# Patient Record
Sex: Male | Born: 1935 | Race: White | Hispanic: No | Marital: Married | Smoking: Former smoker
Health system: Southern US, Community
[De-identification: ages and names within clinical notes are randomized; demographics above are authoritative.]

## PROBLEM LIST (undated history)

## (undated) DIAGNOSIS — Z8719 Personal history of other diseases of the digestive system: Secondary | ICD-10-CM

## (undated) DIAGNOSIS — K449 Diaphragmatic hernia without obstruction or gangrene: Secondary | ICD-10-CM

## (undated) DIAGNOSIS — K219 Gastro-esophageal reflux disease without esophagitis: Secondary | ICD-10-CM

## (undated) DIAGNOSIS — E119 Type 2 diabetes mellitus without complications: Secondary | ICD-10-CM

## (undated) DIAGNOSIS — I2510 Atherosclerotic heart disease of native coronary artery without angina pectoris: Secondary | ICD-10-CM

## (undated) DIAGNOSIS — E785 Hyperlipidemia, unspecified: Secondary | ICD-10-CM

## (undated) DIAGNOSIS — C61 Malignant neoplasm of prostate: Secondary | ICD-10-CM

## (undated) DIAGNOSIS — I10 Essential (primary) hypertension: Secondary | ICD-10-CM

## (undated) HISTORY — DX: Malignant neoplasm of prostate: C61

## (undated) HISTORY — DX: Personal history of other diseases of the digestive system: Z87.19

## (undated) HISTORY — PX: OTHER SURGICAL HISTORY: SHX169

## (undated) HISTORY — DX: Atherosclerotic heart disease of native coronary artery without angina pectoris: I25.10

## (undated) HISTORY — PX: CORONARY ARTERY BYPASS GRAFT: SHX141

## (undated) HISTORY — PX: CHOLECYSTECTOMY: SHX55

## (undated) HISTORY — DX: Diaphragmatic hernia without obstruction or gangrene: K44.9

## (undated) HISTORY — DX: Gastro-esophageal reflux disease without esophagitis: K21.9

## (undated) HISTORY — PX: STOMACH SURGERY: SHX791

## (undated) HISTORY — DX: Type 2 diabetes mellitus without complications: E11.9

## (undated) HISTORY — DX: Hyperlipidemia, unspecified: E78.5

## (undated) HISTORY — DX: Essential (primary) hypertension: I10

---

## 1998-11-04 ENCOUNTER — Ambulatory Visit (HOSPITAL_COMMUNITY): Admission: RE | Admit: 1998-11-04 | Discharge: 1998-11-04 | Payer: Self-pay | Admitting: Geriatric Medicine

## 2002-07-17 ENCOUNTER — Ambulatory Visit (HOSPITAL_COMMUNITY): Admission: RE | Admit: 2002-07-17 | Discharge: 2002-07-17 | Payer: Self-pay | Admitting: Geriatric Medicine

## 2003-09-05 ENCOUNTER — Inpatient Hospital Stay (HOSPITAL_COMMUNITY): Admission: AD | Admit: 2003-09-05 | Discharge: 2003-09-12 | Payer: Self-pay | Admitting: *Deleted

## 2004-05-05 ENCOUNTER — Ambulatory Visit (HOSPITAL_COMMUNITY): Admission: RE | Admit: 2004-05-05 | Discharge: 2004-05-05 | Payer: Self-pay | Admitting: Geriatric Medicine

## 2005-04-29 ENCOUNTER — Ambulatory Visit (HOSPITAL_COMMUNITY): Admission: RE | Admit: 2005-04-29 | Discharge: 2005-04-29 | Payer: Self-pay | Admitting: *Deleted

## 2006-07-09 ENCOUNTER — Encounter: Admission: RE | Admit: 2006-07-09 | Discharge: 2006-07-09 | Payer: Self-pay | Admitting: Geriatric Medicine

## 2009-06-11 ENCOUNTER — Encounter: Admission: RE | Admit: 2009-06-11 | Discharge: 2009-06-11 | Payer: Self-pay | Admitting: Urology

## 2009-10-03 ENCOUNTER — Inpatient Hospital Stay (HOSPITAL_COMMUNITY): Admission: EM | Admit: 2009-10-03 | Discharge: 2009-10-06 | Payer: Self-pay | Admitting: Emergency Medicine

## 2009-10-04 ENCOUNTER — Ambulatory Visit: Payer: Self-pay | Admitting: Internal Medicine

## 2009-10-04 ENCOUNTER — Encounter (INDEPENDENT_AMBULATORY_CARE_PROVIDER_SITE_OTHER): Payer: Self-pay | Admitting: Internal Medicine

## 2009-10-06 ENCOUNTER — Encounter (INDEPENDENT_AMBULATORY_CARE_PROVIDER_SITE_OTHER): Payer: Self-pay | Admitting: *Deleted

## 2009-10-07 ENCOUNTER — Telehealth: Payer: Self-pay | Admitting: Internal Medicine

## 2009-10-09 ENCOUNTER — Encounter: Payer: Self-pay | Admitting: Internal Medicine

## 2009-10-22 ENCOUNTER — Ambulatory Visit: Payer: Self-pay | Admitting: Internal Medicine

## 2009-10-22 DIAGNOSIS — D649 Anemia, unspecified: Secondary | ICD-10-CM | POA: Insufficient documentation

## 2009-10-22 DIAGNOSIS — E538 Deficiency of other specified B group vitamins: Secondary | ICD-10-CM | POA: Insufficient documentation

## 2009-10-22 DIAGNOSIS — K210 Gastro-esophageal reflux disease with esophagitis: Secondary | ICD-10-CM

## 2009-10-29 ENCOUNTER — Ambulatory Visit: Payer: Self-pay | Admitting: Internal Medicine

## 2009-11-05 ENCOUNTER — Ambulatory Visit: Payer: Self-pay | Admitting: Internal Medicine

## 2009-12-03 ENCOUNTER — Ambulatory Visit: Payer: Self-pay | Admitting: Internal Medicine

## 2010-01-06 ENCOUNTER — Ambulatory Visit: Payer: Self-pay | Admitting: Internal Medicine

## 2010-02-11 ENCOUNTER — Ambulatory Visit: Payer: Self-pay | Admitting: Internal Medicine

## 2010-02-21 ENCOUNTER — Encounter: Admission: RE | Admit: 2010-02-21 | Discharge: 2010-02-21 | Payer: Self-pay | Admitting: Geriatric Medicine

## 2010-03-11 ENCOUNTER — Ambulatory Visit: Payer: Self-pay | Admitting: Internal Medicine

## 2010-03-14 ENCOUNTER — Encounter (INDEPENDENT_AMBULATORY_CARE_PROVIDER_SITE_OTHER): Payer: Self-pay | Admitting: *Deleted

## 2010-04-11 ENCOUNTER — Ambulatory Visit: Payer: Self-pay | Admitting: Internal Medicine

## 2010-04-24 ENCOUNTER — Encounter (INDEPENDENT_AMBULATORY_CARE_PROVIDER_SITE_OTHER): Payer: Self-pay | Admitting: *Deleted

## 2010-04-24 ENCOUNTER — Ambulatory Visit: Payer: Self-pay | Admitting: Internal Medicine

## 2010-05-01 ENCOUNTER — Ambulatory Visit: Payer: Self-pay | Admitting: Internal Medicine

## 2010-05-12 ENCOUNTER — Ambulatory Visit: Payer: Self-pay | Admitting: Internal Medicine

## 2010-06-13 ENCOUNTER — Ambulatory Visit: Payer: Self-pay | Admitting: Internal Medicine

## 2010-07-14 ENCOUNTER — Ambulatory Visit: Payer: Self-pay | Admitting: Internal Medicine

## 2010-08-06 ENCOUNTER — Encounter: Payer: Self-pay | Admitting: Internal Medicine

## 2010-08-06 LAB — CONVERTED CEMR LAB: Hemoglobin: 12.7 g/dL

## 2010-08-11 ENCOUNTER — Ambulatory Visit: Payer: Self-pay | Admitting: Internal Medicine

## 2010-08-20 ENCOUNTER — Telehealth: Payer: Self-pay | Admitting: Internal Medicine

## 2010-08-20 ENCOUNTER — Encounter: Payer: Self-pay | Admitting: Internal Medicine

## 2010-08-25 ENCOUNTER — Encounter (INDEPENDENT_AMBULATORY_CARE_PROVIDER_SITE_OTHER): Payer: Self-pay | Admitting: *Deleted

## 2010-08-26 ENCOUNTER — Encounter (INDEPENDENT_AMBULATORY_CARE_PROVIDER_SITE_OTHER): Payer: Self-pay | Admitting: *Deleted

## 2010-08-26 ENCOUNTER — Ambulatory Visit: Payer: Self-pay | Admitting: Internal Medicine

## 2010-09-02 ENCOUNTER — Ambulatory Visit: Payer: Self-pay | Admitting: Internal Medicine

## 2010-09-02 HISTORY — PX: UPPER GASTROINTESTINAL ENDOSCOPY: SHX188

## 2010-09-08 ENCOUNTER — Ambulatory Visit: Payer: Self-pay | Admitting: Internal Medicine

## 2010-10-07 ENCOUNTER — Ambulatory Visit
Admission: RE | Admit: 2010-10-07 | Discharge: 2010-10-07 | Payer: Self-pay | Source: Home / Self Care | Attending: Internal Medicine | Admitting: Internal Medicine

## 2010-10-28 NOTE — Miscellaneous (Signed)
Summary: [facility] PV  Clinical Lists Changes  Observations: Added new observation of NKA: T (08/26/2010 10:33)

## 2010-10-28 NOTE — Assessment & Plan Note (Signed)
Summary: B12 # 3 OF 3, THEN MONTHLY/SP  Nurse Visit   Allergies: No Known Drug Allergies  Medication Administration  Injection # 1:    Medication: Vit B12 1000 mcg    Diagnosis: VITAMIN B12 DEFICIENCY (ICD-266.2)    Route: IM    Site: L deltoid    Exp Date: 07/2011    Lot #: [id number]    Mfr: [employer]    Patient tolerated injection without complications    Given by: Merri Ray CMA (AAMA) (November 05, 2009 8:30 AM)  Orders Added: 1)  Vit B12 1000 mcg [J3420]

## 2010-10-28 NOTE — Assessment & Plan Note (Signed)
Summary: MONTHLY B12..266.2/SP  Nurse Visit   Allergies: No Known Drug Allergies  Medication Administration  Injection # 1:    Medication: Vit B12 1000 mcg    Diagnosis: VITAMIN B12 DEFICIENCY (ICD-266.2)    Route: IM    Site: L deltoid    Exp Date: 04/2011    Lot #: [id number]    Mfr: [employer]    Comments: pt to schedule next montly b12 at front desk.    Patient tolerated injection without complications    Given by: Chales Abrahams CMA Duncan Dull) (December 03, 2009 8:20 AM)  Orders Added: 1)  Vit B12 1000 mcg [J3420]

## 2010-10-28 NOTE — Assessment & Plan Note (Signed)
Summary: MONTHLY B12 SHOT...LSW.  Nurse Visit   Medication Administration  Injection # 1:    Medication: Vit B12 1000 mcg    Diagnosis: VITAMIN B12 DEFICIENCY (ICD-266.2)    Route: IM    Site: R deltoid    Exp Date: 10/2011    Lot #: [id number]    Mfr: [employer]    Comments: pt will return on 03/11/10 for next injection.    Patient tolerated injection without complications    Given by: Francee Piccolo CMA Duncan Dull) (Feb 11, 2010 8:51 AM)  Orders Added: 1)  Vit B12 1000 mcg [J3420]

## 2010-10-28 NOTE — Assessment & Plan Note (Signed)
Summary: MONTHLY B12 SHOT  Nurse Visit   Allergies: No Known Drug Allergies  Medication Administration  Injection # 1:    Medication: Vit B12 1000 mcg    Diagnosis: VITAMIN B12 DEFICIENCY (ICD-266.2)    Route: IM    Site: R deltoid    Exp Date: 12/2011    Lot #: [id number]    Mfr: [employer]    Patient tolerated injection without complications    Given by: Milford Cage NCMA (April 11, 2010 8:44 AM)  Orders Added: 1)  Vit B12 1000 mcg [J3420]

## 2010-10-28 NOTE — Progress Notes (Signed)
Summary: triage  Phone Note Call from Patient Call back at [phone]  (Angie's home)   Caller: granddaughter, Angie Call For: Dr. Leone Payor Reason for Call: Talk to Nurse Summary of Call: pt was recently seen in hospital by Dr. Leone Payor per granddaughter and told by Dr. Leone Payor to sch a f/u appt within 10-14 days... othing available within that time frame Initial call taken by: Vallarie Mare,  October 07, 2009 11:35 AM  Follow-up for Phone Call        I have left appointment information with the patient's grandson.  10-22-09 9:30 Follow-up by: Darcey Nora RN, CGRN,  October 07, 2009 11:48 AM

## 2010-10-28 NOTE — Assessment & Plan Note (Signed)
Summary: f/u discuss endo--ch.   History of Present Illness Visit Type: Follow-up Visit Primary GI MD: Stan Head MD [facility] Primary Provider: Merlene Laughter, MD Chief Complaint: GI bleeding/discuss ENDO History of Present Illness:   1) Severe reflux esophagitsis: no heartburn, no hematemesis. Has not yet had follow-up EGD to ensure that terrible esophagitis is healed.  2) Anemia: he is following with Dr. Pete Glatter and is on ferrous sulfate and B12 (s/p BII)  3) B12 deficiency: receiving parenteral Tx   GI Review of Systems    Reports belching.      Denies abdominal pain, acid reflux, bloating, chest pain, dysphagia with liquids, dysphagia with solids, heartburn, loss of appetite, nausea, vomiting, vomiting blood, weight loss, and  weight gain.        Denies anal fissure, black tarry stools, change in bowel habit, constipation, diarrhea, diverticulosis, fecal incontinence, heme positive stool, hemorrhoids, irritable bowel syndrome, jaundice, light color stool, liver problems, rectal bleeding, and  rectal pain.    EGD  Procedure date:  10/04/2009  Findings:           1) Esophagitis in the total esophagus - biopsies taken - likely     from severe GERD     2) 1 - 2 cm hiatal hernia     3) Gastroenterostomy in the body of the stomach - normal Bilroth     II anatomy and no other abnormalities   1. ESOPHAGUS, BIOPSY:  ACUTE EROSIVE ESOPHAGITIS WITH ASSOCIATED ULCER.  NO EVIDENCE OF VIRAL CYTOPATHIC CHANGES, FUNGAL HYPHAE, INTESTINAL METAPLASIA, DYSPLASIA OR MALIGNANCY IDENTIFIED.   Current Medications (verified): 1)  Vitamin B-12 1000 Mcg Tabs (Cyanocobalamin) .... Take One By Mouth Once Daily 2)  Omeprazole 40 Mg Cpdr (Omeprazole) .... Take One By Mouth Two Times A Day 3)  Metoprolol Succinate 50 Mg Xr24h-Tab (Metoprolol Succinate) .Marland Kitchen.. 1 By Mouth Once Daily 4)  Ferrous Sulfate 325 (65 Fe) Mg Tabs (Ferrous Sulfate) .Marland Kitchen.. 1 By Mouth Once Daily 5)  Fish Oil 1000 Mg Caps (Omega-3  Fatty Acids) .... Take Two By Mouth Once Daily 6)  Flomax 0.4 Mg Caps (Tamsulosin Hcl) .Marland Kitchen.. 1 By Mouth Once Daily 7)  Gemfibrozil 600 Mg Tabs (Gemfibrozil) .Marland Kitchen.. 1 By Mouth Two Times A Day 8)  Glipizide 10 Mg Tabs (Glipizide) .Marland Kitchen.. 1 By Mouth Two Times A Day 9)  Lisinopril 20 Mg Tabs (Lisinopril) .Marland Kitchen.. 1 By Mouth Once Daily 10)  Glucophage 500 Mg Tabs (Metformin Hcl) .Marland Kitchen.. 1 By Mouth Two Times A Day 11)  Simvastatin 40 Mg Tabs (Simvastatin) .Marland Kitchen.. 1 By Mouth Once Daily 12)  Aspirin 81 Mg Tbec (Aspirin) .... Take One By Mouth Once Daily 13)  Centrum Silver  Tabs (Multiple Vitamins-Minerals) .... Take One By Mouth Once Daily 14)  Amlodipine Besylate 10 Mg Tabs (Amlodipine Besylate) .... Take 1/2 Tablet By Mouth Once Daily  Allergies (verified): No Known Drug Allergies  Past History:  Past Medical History: Reviewed history from 10/22/2009 and no changes required. Severe GERD Hiatal hernia Hypertension Diabetes dyslipidemia GI bleed hypotension Prostate Cancer  Past Surgical History: Reviewed history from 10/22/2009 and no changes required. s/p four vessel coronary artery bypass graft s/p Billroth ii Cholecystectomy stomach surgery removed 65%  Family History: Reviewed history from 10/22/2009 and no changes required. patient does not know any family history  Social History: Reviewed history from 10/22/2009 and no changes required. Occupation: retired Patient is a former smoker.  Alcohol Use - no Daily Caffeine Use coffee Illicit Drug Use - no  Review of Systems       no chest pain, dyspnea  Vital Signs:  Patient profile:   77 year old male Height:      65 inches Weight:      159.13 pounds BMI:     26.58 Pulse rate:   60 / minute Pulse rhythm:   regular BP sitting:   150 / 60  (left arm) Cuff size:   regular  Vitals Entered By: June McMurray CMA Duncan Dull) (April 24, 2010 3:45 PM)  Physical Exam  General:  elderly, NAD Lungs:  clear Heart:  s1s2 rrr 3/6 systolic  ejection  murmur   Impression & Recommendations:  Problem # 1:  REFLUX ESOPHAGITIS (ICD-530.11) Assessment Unchanged Given jow severe it was at last EGD, will re-EGD to be sure tx is working. Risks, benefits,and indications of endoscopic procedure(s) were reviewed with the patient and all questions answered.  Orders: EGD (EGD)  Problem # 2:  ANEMIA-UNSPECIFIED (ICD-285.9) Assessment: Unchanged will review recent cbc's  Problem # 3:  VITAMIN B12 DEFICIENCY (ICD-266.2) Assessment: Improved continue Tx may go to every 3 mos once labs reviewed  Patient Instructions: 1)  We will see you at your procedure on 05/01/10. 2)  [facility] Patient Information Guide given to patient.  3)  Upper Endoscopy brochure given.  4)  Copy sent to : Merlene Laughter, MD 5)  The medication list was reviewed and reconciled.  All changed / newly prescribed medications were explained.  A complete medication list was provided to the patient / caregiver.

## 2010-10-28 NOTE — Discharge Summary (Signed)
Hector Gordon, Hector Gordon                 ACCOUNT NO.:  [id number]      MEDICAL RECORD NO.:  [id number]          PATIENT TYPE:  INP      LOCATION:  5523                         FACILITY:  [facility]      PHYSICIAN:  Richarda Overlie, MD       DATE OF BIRTH:  05/19/1925      DATE OF ADMISSION:  10/03/2009   DATE OF DISCHARGE:  10/06/2009                                  DISCHARGE SUMMARY      PRIMARY CARE PHYSICIAN:  Hal T. Stoneking, M.D.      CHIEF COMPLAINT:  Vomiting blood.      DISCHARGE DIAGNOSES:   1. Esophagitis and severe gastroesophageal reflux disease.   2. A 1-2 cm hiatal hernia.   3. Status post Billroth II.   4. Hypertension.   5. Diabetes insulin dependent.   6. Dyslipidemia.   7. Status post four vessel coronary artery bypass graft.      SUBJECTIVE:  This is an 77 year old Caucasian male with history of the   above history who presents to the ER with the chief complaint of   vomiting bright red blood associated with mild dizziness.  The patient   had a couple of episodes prior to his presentation.  The patient at the   time of admission has also complained of a history of chronic black   stools for about a year.  The patient was initially found to be   hemodynamically stable.  His initial hemoglobin was 10.3.  He was   admitted for acute upper GI bleeding and had the following hospital   course.      HOSPITAL COURSE:   1. Gastrointestinal bleeding.  The patient was evaluated by [facility]       [facility], Dr. Leone Payor, and EGD was done on January 7 that showed       esophagitis throughout the length of the esophagus secondary to       gastroesophageal reflux disease.  Biopsies were done and results       were awaited.  Proton pump inhibitor b.i.d. has been recommended.       The patient also had an anemia panel and was found to have low       vitamin B12 level and has been started on vitamin B12       supplementation.   2. Hypotension.  During his hospitalization his antihypertensive         medications were held.  The patient was being restarted on half the       dose of his metoprolol, will continue his ACE inhibitor, will hold       his Norvasc.      DISPOSITION:  The patient's diet was advanced and he has tolerated   mechanical soft 1800 ADA cardiac diet which he will continue.  He is to   schedule an appointment with his PCP in 5-7 days and have a repeat CBC   done in 1 week.      DISCHARGE MEDICATIONS:   1. Cyanocobalamin 1000 mcg p.o.  daily.   2. Protonix 40 mg p.o. twice a day.   3. Metoprolol 50 mg p.o. once a day.   4. Centrum Silver 1 tablet p.o. daily.   5. Ferrous sulfate 325 p.o. daily.   6. Fish oil 1000 mg p.o. twice a day.   7. Flomax 0.4 mg.   8. Gemfibrozil 600 mg p.o. twice a day.   9. Glipizide 10 mg p.o. twice a day.   10.Lisinopril 20 mg p.o. daily.   11.Metformin 500 mg p.o. twice a day.   12.Simvastatin 40 mg p.o. daily.      ON HOLD MEDICATIONS:  Norvasc, aspirin.               Richarda Overlie, MD            NA/MEDQ  D:  10/06/2009  T:  10/06/2009  Job:  [id number]      cc:   Hal T. Stoneking, M.D.

## 2010-10-28 NOTE — Assessment & Plan Note (Signed)
Summary: MONTHLY B12 SHOT...LSW.  Nurse Visit   Allergies: No Known Drug Allergies  Medication Administration  Injection # 1:    Medication: Vit B12 1000 mcg    Diagnosis: VITAMIN B12 DEFICIENCY (ICD-266.2)    Route: IM    Site: L deltoid    Exp Date: 04/2012    Lot #: [id number]    Mfr: [employer]    Comments: Patient signed a ROI and Judeth Cornfield will fax it to Dr. Lesia Sago office to get copies of the patients 2011 labs. Once she gets the labs back she will call the patient to advise him if he needs b12 injections once a month or once every three months. I have advised pt that he does not need to make an appt for his next one we will call him.    Patient tolerated injection without complications    Given by: Harlow Mares CMA (AAMA) (August 11, 2010 9:28 AM)  Orders Added: 1)  Vit B12 1000 mcg [J3420]  Appended Document: MONTHLY B12 SHOT...LSW. for now let's do monthly B12 - until other notice  Pt notified that Dr. Leone Payor would like for him to have monthly injections.  Pt is agreeable with this plan.  Appt is scheudled for Monday 09/08/10 for next injection. Francee Piccolo CMA Duncan Dull)  September 03, 2010 12:00 PM

## 2010-10-28 NOTE — Letter (Signed)
Summary: Endoscopy- Changed to Office Visit  [facility]  [address] [place], [place] [address]   Phone: [phone]  Fax: [phone]      March 14, 2010 MRN: [id number]   [facility] [facility] [address] [place], [place]  [address]   Dear Mr. Deschler,   According to our records, it is time for you to schedule an Endoscopy. However, after reviewing your medical record, I feel that an office visit would be most appropriate to more completely evaluate you and determine your need for a repeat procedure.  Please call [phone] (option #2) at your convenience to schedule an office visit. If you have any questions, concerns, or feel that this letter is in error, we would appreciate your call.   Sincerely,     Iva Boop, M.D.  [facility]astro[facility] [phone]

## 2010-10-28 NOTE — Assessment & Plan Note (Signed)
Summary: MONTHLY B12 SHOT...LSW.  Nurse Visit   Allergies: No Known Drug Allergies  Medication Administration  Injection # 1:    Medication: Vit B12 1000 mcg    Diagnosis: VITAMIN B12 DEFICIENCY (ICD-266.2)    Route: IM    Site: L deltoid    Exp Date: 03/2012    Lot #: [id number]    Mfr: [employer]    Patient tolerated injection without complications    Given by: Merri Ray CMA (AAMA) (July 14, 2010 9:07 AM)  Orders Added: 1)  Vit B12 1000 mcg [J3420]

## 2010-10-28 NOTE — Letter (Signed)
Summary: EGD Instructions  [facility]  [address] [place], [place] [address]   Phone: [phone]  Fax: [phone]       Hector Gordon    Feb 25, 1925    MRN: [id number]       Procedure Day /Date: Tuesday 09-02-10     Arrival Time:  10:00 a.m.     Procedure Time: 11:00 a.m.     Location of Procedure:                    _ x _ [facility] (4th Floor)   PREPARATION FOR ENDOSCOPY   On Tuesday 09-02-10,  THE DAY OF THE PROCEDURE:  1.   No solid foods, milk or milk products are allowed after midnight the night before your procedure.  2.   Do not drink anything colored red or purple.  Avoid juices with pulp.  No orange juice.  3.  You may drink clear liquids until 9:00 a.m. , which is 2 hours before your procedure.                                                                                                CLEAR LIQUIDS INCLUDE: Water Jello Ice Popsicles Tea (sugar ok, no milk/cream) Powdered fruit flavored drinks Coffee (sugar ok, no milk/cream) Gatorade Juice: apple, white grape, white cranberry  Lemonade Clear bullion, consomm, broth Carbonated beverages (any kind) Strained chicken noodle soup Hard Candy   MEDICATION INSTRUCTIONS  Unless otherwise instructed, you should take regular prescription medications with a small sip of water as early as possible the morning of your procedure.  Diabetic patients - see separate instructions.   Additional medication instructions: Stop Ferrous Sulfate 5 days before procedure.             OTHER INSTRUCTIONS  You will need a responsible adult at least 75 years of age to accompany you and drive you home.   This person must remain in the waiting room during your procedure.  Wear loose fitting clothing that is easily removed.  Leave jewelry and other valuables at home.  However, you may wish to bring a book to read or an iPod/MP3 player to listen to music as you wait for your procedure to  start.  Remove all body piercing jewelry and leave at home.  Total time from sign-in until discharge is approximately 2-3 hours.  You should go home directly after your procedure and rest.  You can resume normal activities the day after your procedure.  The day of your procedure you should not:   Drive   Make legal decisions   Operate machinery   Drink alcohol   Return to work  You will receive specific instructions about eating, activities and medications before you leave.    The above instructions have been reviewed and explained to me by   Ezra Sites RN  August 26, 2010 11:15 AM     I fully understand and can verbalize these instructions _____________________________ Date _________

## 2010-10-28 NOTE — Assessment & Plan Note (Signed)
Summary: B12 SHOT..AM.  Nurse Visit   Allergies: No Known Drug Allergies  Medication Administration  Injection # 1:    Medication: Vit B12 1000 mcg    Diagnosis: VITAMIN B12 DEFICIENCY (ICD-266.2)    Route: IM    Site: L deltoid    Exp Date: 10/2011    Lot #: [id number]    Mfr: [employer]    Comments: pt to schedule monthly b12 at front desk    Patient tolerated injection without complications    Given by: Chales Abrahams CMA Duncan Dull) (January 06, 2010 8:42 AM)  Orders Added: 1)  Vit B12 1000 mcg [J3420]

## 2010-10-28 NOTE — Letter (Signed)
Summary: Diabetic Instructions  [facility]  [address] [place], [place] [address]   Phone: [phone]  Fax: [phone]    Hector Gordon 29-Oct-1924 MRN: [id number]   Glucophage and Glipizide  ORAL DIABETIC MEDICATION INSTRUCTIONS  The day before your procedure:   Take your diabetic pill as you do normally  The day of your procedure:   Do not take your diabetic pill    We will check your blood sugar levels during the admission process and again in Recovery before discharging you home  ________________________________________________________________________

## 2010-10-28 NOTE — Procedures (Signed)
Summary: Upper Endoscopy  Patient: [name] Note: All result statuses are Final unless otherwise noted.  Tests: (1) Upper Endoscopy (EGD)   EGD Upper Endoscopy       DONE     [facility]     [address]     [place], [place]  [address]           ENDOSCOPY PROCEDURE REPORT           PATIENT:  Hector Gordon, Hector Gordon  MR#:  [id number]     BIRTHDATE:  11-16-1924, 85 yrs. old  GENDER:  male           ENDOSCOPIST:  Iva Boop, MD, [facility]           PROCEDURE DATE:  05/01/2010     PROCEDURE:  EGD, diagnostic     ASA CLASS:  Class II     INDICATIONS:  follow-up of esophageal ulcer Follow-up of severe     ulcerative esophagitis           MEDICATIONS:   Fentanyl 25 mcg IV, Versed 2 mg IV     TOPICAL ANESTHETIC:  Exactacain Spray           DESCRIPTION OF PROCEDURE:   After the risks benefits and     alternatives of the procedure were thoroughly explained, informed     consent was obtained.  The LB [id number] endoscope was     introduced through the mouth and advanced to the proximal jejunum,     without limitations.  The instrument was slowly withdrawn as the     mucosa was fully examined.     <<PROCEDUREIMAGES>>           There was severe esophagitis from 25-35 cm (z-line). Multiple     serpiginous ulcers up to 2 cm long. Less severe than last exam.     Post-operative change was noted in the antrum. Bilroth II     gastrectomy.  A hiatal hernia was found. It was 1 - 2 cm in size.     Otherwise the examination was normal.    Retroflexed views revealed     a hiatal hernia.    The scope was then withdrawn from the patient     and the procedure completed.           COMPLICATIONS:  None           ENDOSCOPIC IMPRESSION:     1) severe, but improved ulcerative esophagitis (from reflux)     2)  Post-operative change in the antrum - Bilroth II gastrectomy     and enterostomy     3) 1 - 2 cm hiatal hernia     4) Otherwise normal examination     RECOMMENDATIONS:     Start  sucralfate 1 gram 4 times a day. Prescription sent.           REPEAT EXAM:  In 3 month(s) for EGD.           Iva Boop, MD, [facility]           CC:  Merlene Laughter, MD     The Patient           n.     Rosalie Doctor:   Iva Boop at 05/01/2010 09:36 AM           Elita Boone, [phone]  Note: An exclamation mark (!) indicates a result that was not dispersed into the flowsheet. Document  Creation Date: 05/01/2010 9:38 AM _______________________________________________________________________  (1) Order result status: Final Collection or observation date-time: 05/01/2010 09:21 Requested date-time:  Receipt date-time:  Reported date-time:  Referring Physician:   Ordering Physician: Stan Head [phone]) Specimen Source:  Source: Launa Grill Order Number: [id number] Lab site:   Appended Document: Upper Endoscopy    Clinical Lists Changes  Observations: Added new observation of EGD DUE: 07/2010 (05/01/2010 13:50)

## 2010-10-28 NOTE — Assessment & Plan Note (Signed)
Summary: B12 SHOT  Nurse Visit   Allergies: No Known Drug Allergies  Medication Administration  Injection # 1:    Medication: Vit B12 1000 mcg    Diagnosis: VITAMIN B12 DEFICIENCY (ICD-266.2)    Route: IM    Site: L deltoid    Exp Date: 7/13    Lot #: [id number]    Mfr: [employer]    Patient tolerated injection without complications    Given by: Lamona Curl CMA (AAMA) (June 13, 2010 8:32 AM)  Orders Added: 1)  Vit B12 1000 mcg [J3420]   Medication Administration  Injection # 1:    Medication: Vit B12 1000 mcg    Diagnosis: VITAMIN B12 DEFICIENCY (ICD-266.2)    Route: IM    Site: L deltoid    Exp Date: 7/13    Lot #: [id number]    Mfr: [employer]    Patient tolerated injection without complications    Given by: Lamona Curl CMA (AAMA) (June 13, 2010 8:32 AM)  Orders Added: 1)  Vit B12 1000 mcg [J3420]

## 2010-10-28 NOTE — Assessment & Plan Note (Signed)
Summary: B12 #2 OF 3/SP  Nurse Visit   Allergies: No Known Drug Allergies  Medication Administration  Injection # 1:    Medication: Vit B12 1000 mcg    Diagnosis: VITAMIN B12 DEFICIENCY (ICD-266.2)    Route: IM    Site: L deltoid    Exp Date: 07/2011    Lot #: [id number]    Mfr: [employer]    Comments: pt already scheduled for 3rd next b12    Patient tolerated injection without complications    Given by: Merri Ray CMA (AAMA) (October 29, 2009 8:42 AM)  Orders Added: 1)  Vit B12 1000 mcg [J3420]

## 2010-10-28 NOTE — Letter (Signed)
Summary: Patient [facility] Biopsy Results  [facility]  [address] [place], [place] [address]   Phone: [phone]  Fax: [phone]        October 09, 2009 MRN: [id number]    [facility] Gordon [address] [place], [place]  [address]    Dear Mr. Piscopo,  I am pleased to inform you that the biopsies taken during your recent endoscopic examination did not show any evidence of cancer upon pathologic examination. There was inflammation from severe acid reflux.  Continue with the treatment plan as outlined on the day of your      exam and follow-up with me in the office as planned.  You should have a repeat endoscopic examination for this problem              in 3 months.   Please call us if you are having persistent problems or have questions about your condition that have not been fully answered at this time.  Sincerely,  Iva Boop MD, [facility]  This letter has been electronically signed by your physician.  Appended Document: Patient Notice-Endo Biopsy Results letter mailed to patient's home

## 2010-10-28 NOTE — Assessment & Plan Note (Signed)
Summary: psot hospital follow up EGD/sheri   History of Present Illness Visit Type: Initial Visit Primary GI MD: Stan Head MD [facility] Primary Provider: Merlene Laughter, MD Chief Complaint: Patient here following up on EGD while inpt. States that he has had no problems since going home. He seen Dr. Pete Glatter last Thursday and had lab work done.  History of Present Illness:   He is here after I met him and preformed endoscopy for hemeateesis in the hospital (Jan6-9). He had severe ulcerative esophagitis and was strted on omeprazole 40 mg two times a day. He went to the [place] yesterday and was prescribed 20 mg two times a day. No hematemesis, no heartburn, no vomiting or regurgitation.  He saw Dr. Pete Glatter, and labs were drawn, results not known yet.  We reviewed events of hospitalization, EGD and pathology, labs.   GI Review of Systems      Denies abdominal pain, acid reflux, belching, bloating, chest pain, dysphagia with liquids, dysphagia with solids, heartburn, loss of appetite, nausea, vomiting, vomiting blood, weight loss, and  weight gain.        Denies anal fissure, black tarry stools, change in bowel habit, constipation, diarrhea, diverticulosis, fecal incontinence, heme positive stool, hemorrhoids, irritable bowel syndrome, jaundice, light color stool, liver problems, rectal bleeding, and  rectal pain. Preventive Screening-Counseling & Management  Alcohol-Tobacco     Smoking Status: quit      Drug Use:  no.      Current Medications (verified): 1)  Vitamin B-12 1000 Mcg Tabs (Cyanocobalamin) .... Take One By Mouth Once Daily 2)  Omeprazole 40 Mg Cpdr (Omeprazole) .... Take One By Mouth Two Times A Day 3)  Metoprolol Succinate 50 Mg Xr24h-Tab (Metoprolol Succinate) .Marland Kitchen.. 1 By Mouth Once Daily 4)  Ferrous Sulfate 325 (65 Fe) Mg Tabs (Ferrous Sulfate) .Marland Kitchen.. 1 By Mouth Once Daily 5)  Fish Oil 1000 Mg Caps (Omega-3 Fatty Acids) .... Take Two By Mouth Once Daily 6)  Flomax 0.4 Mg Caps  (Tamsulosin Hcl) .Marland Kitchen.. 1 By Mouth Once Daily 7)  Gemfibrozil 600 Mg Tabs (Gemfibrozil) .Marland Kitchen.. 1 By Mouth Two Times A Day 8)  Glipizide 10 Mg Tabs (Glipizide) .Marland Kitchen.. 1 By Mouth Two Times A Day 9)  Lisinopril 20 Mg Tabs (Lisinopril) .Marland Kitchen.. 1 By Mouth Once Daily 10)  Glucophage 500 Mg Tabs (Metformin Hcl) .Marland Kitchen.. 1 By Mouth Two Times A Day 11)  Simvastatin 40 Mg Tabs (Simvastatin) .Marland Kitchen.. 1 By Mouth Once Daily 12)  Aspirin 81 Mg Tbec (Aspirin) .... Take One By Mouth Once Daily 13)  Centrum Silver  Tabs (Multiple Vitamins-Minerals) .... Take One By Mouth Once Daily 14)  Amlodipine Besylate 10 Mg Tabs (Amlodipine Besylate) .... Take 1/2 Tablet By Mouth Once Daily  Allergies (verified): No Known Drug Allergies  Past History:  Past Medical History: Severe GERD Hiatal hernia Hypertension Diabetes dyslipidemia GI bleed hypotension Prostate Cancer  Past Surgical History: s/p four vessel coronary artery bypass graft s/p Billroth ii Cholecystectomy stomach surgery removed 65%  Family History: patient does not know any family history  Social History: Occupation: retired Patient is a former smoker.  Alcohol Use - no Daily Caffeine Use coffee Illicit Drug Use - no Smoking Status:  quit Drug Use:  no  Vital Signs:  Patient profile:   77 year old male Height:      65 inches Weight:      156.0 pounds BMI:     26.05 Pulse rate:   48 / minute Pulse rhythm:  regular BP sitting:   118 / 44  (left arm) Cuff size:   regular  Vitals Entered By: Harlow Mares CMA Duncan Dull) (October 22, 2009 9:28 AM)  Physical Exam  General:  elderly, NAD   Impression & Recommendations:  Problem # 1:  REFLUX ESOPHAGITIS (ICD-530.11) Assessment Improved Severe ulcerative esophagitis causing hematemesis, in setting of Bilroth II anatomy. Is better onPPI. was on omeprazole 40 mg two times a day and now on 20 mg two times a day through [place] rx (this dosing is acceptable) repeat EGD in 3 months as important to  document healing and prevent further bleeding by adjusting therapy if needed  Problem # 2:  ANEMIA-UNSPECIFIED (ICD-285.9) Assessment: Unchanged multifactorial from blood loss and his B12 is low replete both he will follow-up with Dr. Pete Glatter and cbc  Problem # 3:  VITAMIN B12 DEFICIENCY (ICD-266.2) Assessment: Unchanged He received 1, maybe 2 injections at hospital and none since B12 level was low while on by mouth B12 which is not likely to work in setting of Bilroth II concomitant metformin may also play a role resume injections today,weekly x 3 then monthly after 3 mos may be able to go every 3 mos...will discuss at EGD  Problem # 4:  SCREENING COLORECTAL-CANCER (ICD-V76.51) Assessment: Comment Only he has this taken care of through [place] and colonoscopy is up to date  Patient Instructions: 1)  Change omeprazole to 20 mg two times a day, take 30 mins before breakfast and supper. 2)  You should stop the oral vitamin B12 and take the injections, since your vitamn B12 level was low, despite taking the oral B12 pill. 3)  Keep follow-up with Dr. Pete Glatter as planned. 4)  This office will contact you in about 2-3 months about scheduling another endoscopy to make sure the esophagus has healed. 5)  IF YOU DO NOT HEAR FROM [place] BY APRIL, CALL us. 6)  Copy sent to : Merlene Laughter, MD 7)  The medication list was reviewed and reconciled.  All changed / newly prescribed medications were explained.  A complete medication list was provided to the patient / caregiver.  Appended Document: psot hospital follow up EGD/sheri Clinical Lists Changes  Orders: Added new Service order of Vit B12 1000 mcg (Q4696) - Signed  Injection given at office visit on 10/22/09.  Medication Administration  Injection # 1:    Medication: Vit B12 1000 mcg    Diagnosis: VITAMIN B12 DEFICIENCY (ICD-266.2)    Route: IM    Site: L deltoid    Exp Date: 06/2011    Lot #: [id number]    Mfr: [employer]    Comments: pt  will return on 2/1 for next injection    Patient tolerated injection without complications    Given by: Francee Piccolo CMA (AAMA)  Orders Added: 1)  Vit B12 1000 mcg [J3420]

## 2010-10-28 NOTE — Assessment & Plan Note (Signed)
Summary: MONTHLY B12 INJ..266.2/SP  Nurse Visit   Allergies: No Known Drug Allergies  Medication Administration  Injection # 1:    Medication: Vit B12 1000 mcg    Diagnosis: VITAMIN B12 DEFICIENCY (ICD-266.2)    Route: IM    Site: R deltoid    Exp Date: 10/30/2011    Lot #: [id number]    Mfr: [employer]    Patient tolerated injection without complications    Given by: Christie Nottingham CMA Duncan Dull) (March 11, 2010 8:46 AM)  Orders Added: 1)  Vit B12 1000 mcg [J3420]

## 2010-10-28 NOTE — Progress Notes (Signed)
Summary: needs EGD to follow-up esophagitis  Phone Note Outgoing Call   Summary of Call: he is due for an EGD please arrange ok for [facility] not sure why but have not received paper recall  Hgb 12.7 on 08/06/10 Iva Boop MD, [facility]  August 20, 2010 6:09 PM   Follow-up for Phone Call        Left message for patient to call back Darcey Nora RN, [facility]  August 25, 2010 8:34 AM  Patient  is scheduled for EGD 09/02/10 11:00, pre-visit 08/26/10 11:00 Follow-up by: Darcey Nora RN, CGRN,  August 25, 2010 11:16 AM

## 2010-10-28 NOTE — Letter (Signed)
Summary: Diabetic Instructions  [facility]  [address] [place], [place] [address]   Phone: [phone]  Fax: [phone]    Hector Gordon [birth year] MRN: [id number]   _X_   ORAL DIABETIC MEDICATION INSTRUCTIONS  The day before your procedure:   Take your diabetic pill as you do normally  The day of your procedure:   Do not take your diabetic pill    We will check your blood sugar levels during the admission process and again in Recovery before discharging you home

## 2010-10-28 NOTE — Procedures (Signed)
Summary: Upper Endoscopy  Patient: Katsumi Wisler Note: All result statuses are Final unless otherwise noted.  Tests: (1) Upper Endoscopy (EGD)   EGD Upper Endoscopy       DONE     [facility]     [address]     [place], [place]  [address]           ENDOSCOPY PROCEDURE REPORT           PATIENT:  Malikai, Gut  MR#:  [id number]     BIRTHDATE:  [birth year], 84 yrs. old  GENDER:  male           ENDOSCOPIST:  Iva Boop, MD, [facility]     Referred by:           Hospitalist           PROCEDURE DATE:  10/04/2009     PROCEDURE:  EGD with biopsy     ASA CLASS:  Class III     INDICATIONS:  hematemesis           MEDICATIONS:   Fentanyl 25 mcg IV, Versed 2 mg IV     TOPICAL ANESTHETIC:  Cetacaine Spray           DESCRIPTION OF PROCEDURE:   After the risks benefits and     alternatives of the procedure were thoroughly explained, informed     consent was obtained.  The [id number] ([id number]) endoscope was     introduced through the mouth and advanced to the proximal jejunum,     without limitations.  The instrument was slowly withdrawn as the     mucosa was fully examined.     <<PROCEDUREIMAGES>>           Esophagitis was found in the total esophagus. Severe ulcerative     esophagitis with ulcers seen in proximal esophagus and changing to     confluent ulceration of distal esophagus. Contact heme seen.     Multiple biopsies were obtained and sent to pathology.  A hiatal     hernia was found. It was 1 - 2 cm in size.  There is a     gastroenterostomy in the body of the stomach. Normal Bilroth II     anatomy seen otherwise. No other endoscopic abnormalities seen.     Retroflexed views revealed a hiatal hernia.    The scope was then     withdrawn from the patient and the procedure completed.           COMPLICATIONS:  None           ENDOSCOPIC IMPRESSION:     1) Esophagitis in the total esophagus - biopsies taken - likely     from severe GERD     2) 1 - 2 cm hiatal  hernia     3) Gastroenterostomy in the body of the stomach - normal Bilroth     II anatomy and no other abnormalities     RECOMMENDATIONS:     1) await biopsy results     Reflux precautions, ead of bed up.     bid PPI, chronically     await anemia panel results, ? B12 deficient s/p Bilroth II     home in 1-2 days of ok           REPEAT EXAM:  await biopsies to determine, overall I suspect a     repeat EGD in 3 months after continued PPI therapy is  reasonable,     to see if he has healed and guide long-term therapy.           Iva Boop, MD, [facility]           CC:  Merlene Laughter, MD     The Patient           n.     Rosalie Doctor:   Iva Boop at 10/04/2009 03:14 PM           [place], [place], [phone]  Note: An exclamation mark (!) indicates a result that was not dispersed into the flowsheet. Document Creation Date: 10/04/2009 3:48 PM _______________________________________________________________________  (1) Order result status: Final Collection or observation date-time: 10/04/2009 15:04 Requested date-time:  Receipt date-time:  Reported date-time:  Referring Physician:   Ordering Physician: Stan Head [phone]) Specimen Source:  Source: Launa Grill Order Number: [id number] Lab site:

## 2010-10-28 NOTE — Assessment & Plan Note (Signed)
Summary: MONTHLY B12 SHOT...LSW.  Nurse Visit   Allergies: No Known Drug Allergies  Medication Administration  Injection # 1:    Medication: Vit B12 1000 mcg    Diagnosis: VITAMIN B12 DEFICIENCY (ICD-266.2)    Route: IM    Site: R deltoid    Exp Date: 12/28/2011    Lot #: [id number]    Mfr: [employer]    Patient tolerated injection without complications    Given by: Harlow Mares CMA (AAMA) (May 12, 2010 8:38 AM)  Orders Added: 1)  Vit B12 1000 mcg [J3420]

## 2010-10-28 NOTE — Letter (Signed)
Summary: EGD Instructions  [facility]  [address] [place], [place] [address]   Phone: [phone]  Fax: [phone]       Hector Gordon    [birth year]    MRN: [id number]       Procedure Day [name]: [name], 05/01/10     Arrival Time: 8:00 AM     Procedure Time: 9:00 AM    Location of Procedure:                    _X_ [facility] (4th Floor)  PREPARATION FOR ENDOSCOPY   On THURSDAY, 05/01/10 THE DAY OF THE PROCEDURE:  1.   No solid foods, milk or milk products are allowed after midnight the night before your procedure.  2.   Do not drink anything colored red or purple.  Avoid juices with pulp.  No orange juice.  3.  You may drink clear liquids until 7:00 AM, which is 2 hours before your procedure.                                                                                                CLEAR LIQUIDS INCLUDE: Water Jello Ice Popsicles Tea (sugar ok, no milk/cream) Powdered fruit flavored drinks Coffee (sugar ok, no milk/cream) Gatorade Juice: apple, white grape, white cranberry  Lemonade Clear bullion, consomm, broth Carbonated beverages (any kind) Strained chicken noodle soup Hard Candy   MEDICATION INSTRUCTIONS  Unless otherwise instructed, you should take regular prescription medications with a small sip of water as early as possible the morning of your procedure.  Diabetic patients - see separate instructions.                 OTHER INSTRUCTIONS  You will need a responsible adult at least 75 years of age to accompany you and drive you home.   This person must remain in the waiting room during your procedure.  Wear loose fitting clothing that is easily removed.  Leave jewelry and other valuables at home.  However, you may wish to bring a book to read or an iPod/MP3 player to listen to music as you wait for your procedure to start.  Remove all body piercing jewelry and leave at home.  Total time from sign-in until  discharge is approximately 2-3 hours.  You should go home directly after your procedure and rest.  You can resume normal activities the day after your procedure.  The day of your procedure you should not:   Drive   Make legal decisions   Operate machinery   Drink alcohol   Return to work  You will receive specific instructions about eating, activities and medications before you leave.    The above instructions have been reviewed and explained to me by   _______________________    I fully understand and can verbalize these instructions _____________________________ Date _________

## 2010-10-28 NOTE — Miscellaneous (Signed)
Summary: sucralfate rx  Clinical Lists Changes  Medications: Added new medication of SUCRALFATE 1 GM TABS (SUCRALFATE) 1 by mouth 4 times a day (at/with meals and at bedtime) - Signed Rx of SUCRALFATE 1 GM TABS (SUCRALFATE) 1 by mouth 4 times a day (at/with meals and at bedtime);  #120 x 11;  Signed;  Entered by: Iva Boop MD, [facility];  Authorized by: Iva Boop MD, FACG;  Method used: Electronically to [facility] [phone] [address] [place], [place], [place]  [id number], Ph: [phone], Fax: [phone]    Prescriptions: SUCRALFATE 1 GM TABS (SUCRALFATE) 1 by mouth 4 times a day (at/with meals and at bedtime)  #120 x 11   Entered and Authorized by:   Iva Boop MD, [facility]   Signed by:   Iva Boop MD, [facility] on 05/01/2010   Method used:   Electronically to        [facility] (retail)       [phone] [address]       [place], [place]  [id number]       Ph: [phone]       Fax: [phone]   RxID:   [id number]

## 2010-10-29 HISTORY — PX: CARDIAC VALVE REPLACEMENT: SHX585

## 2010-10-30 NOTE — Assessment & Plan Note (Signed)
Summary: MONTHLY B12 SHOT...LSW.  Nurse Visit   Allergies: No Known Drug Allergies  Medication Administration  Injection # 1:    Medication: Vit B12 1000 mcg    Diagnosis: VITAMIN B12 DEFICIENCY (ICD-266.2)    Route: IM    Site: L deltoid    Exp Date: 06/28/2012    Lot #: [id number]    Mfr: [employer]    Comments: Next injection scheduled for 11/07/10 8:30    Patient tolerated injection without complications    Given by: Darcey Nora RN, CGRN (October 07, 2010 8:58 AM)  Orders Added: 1)  Vit B12 1000 mcg [J3420]

## 2010-10-30 NOTE — Letter (Signed)
Summary: Patient [facility] Biopsy Results  [facility]  [address] [place], [place] [address]   Phone: [phone]  Fax: [phone]        September 08, 2010 MRN: [id number]    Hector Gordon [address] [place], [place]  [address]    Dear Hector Gordon,  The biopsies taken during your recent endoscopic examination still show inflammation but no infection or other problems. I believe this is due to reflux of stomach juice and intestinal juices into your esophagus.  I think this is about the best that can be done as far as treating it and you seem to be ok on your current medications so will not plan to recheck it or change medications.  Please continue on these medications and see Dr. Pete Glatter regularly, as he recommends.  I can see you as needed and you scheduled see me once a year otherwise.  Please call us if you are having persistent problems or have questions about your condition that have not been fully answered at this time.  Sincerely,  Iva Boop MD, [facility]  This letter has been electronically signed by your physician.  Appended Document: Patient Notice-Endo Biopsy Results Letter mailed

## 2010-10-30 NOTE — Procedures (Signed)
Summary: Upper Endoscopy  Patient: Jomar Denz Note: All result statuses are Final unless otherwise noted.  Tests: (1) Upper Endoscopy (EGD)   EGD Upper Endoscopy       DONE     [facility]     [address]     [place], [place]  [address]           ENDOSCOPY PROCEDURE REPORT           PATIENT:  Keyion, Knack  MR#:  [id number]     BIRTHDATE:  01/15/1925, 85 yrs. old  GENDER:  male           ENDOSCOPIST:  Iva Boop, MD, [facility]           PROCEDURE DATE:  09/02/2010     PROCEDURE:  EGD with biopsy, [address]     ASA CLASS:  Class II     INDICATIONS:  follow-up of ulcerative esophagitis           MEDICATIONS:   Fentanyl 25 mcg IV, Versed 2 mg IV     TOPICAL ANESTHETIC:  Exactacain Spray           DESCRIPTION OF PROCEDURE:   After the risks benefits and     alternatives of the procedure were thoroughly explained, informed     consent was obtained.  The [id number] endoscope was     introduced through the mouth and advanced to the proximal jejunum,     without limitations.  The instrument was slowly withdrawn as the     mucosa was fully examined.     <<PROCEDUREIMAGES>>           Multiple ulcers were found in the total esophagus. 10 cm (25-35)     of the esophagus involved with multiple serpiginous ulcers up to 2     cm long as before.  Post-operative change was noted in the antrum.     S/P Bilroth II.    Retroflexion in the stomach was not done but     has been done twice in past year.    The scope was then withdrawn     from the patient and the procedure completed.           COMPLICATIONS:  None           ENDOSCOPIC IMPRESSION:     1) Ulcers, multiple in the total esophagus - Ulcerative     esophagitis about the same to slightly better     2) Post-operative change in the antrum S/P Bilroth II     RECOMMENDATIONS:     1) continue current medications     2) This may be as good as it gets as far as mucosal healing.     He seems ok from a clinical standpoint  and adding sucralfate did     not seem to improve things much if at all.     3) I will let him know about biopsies.           REPEAT EXAM:  In for as needed.           Iva Boop, MD, [facility]           CC:  Merlene Laughter, MD     The Patient           n.     Rosalie Doctor:   Iva Boop at 09/02/2010 12:20 PM  [place], [place], [phone]  Note: An exclamation mark (!) indicates a result that was not dispersed into the flowsheet. Document Creation Date: 09/02/2010 12:20 PM _______________________________________________________________________  (1) Order result status: Final Collection or observation date-time: 09/02/2010 12:09 Requested date-time:  Receipt date-time:  Reported date-time:  Referring Physician:   Ordering Physician: Stan Head [phone]) Specimen Source:  Source: Launa Grill Order Number: [id number] Lab site:   Appended Document: Upper Endoscopy   EGD  Procedure date:  09/02/2010  Findings:         1) Ulcers, multiple in the total esophagus - Ulcerative     esophagitis about the same to slightly better     2) Post-operative change in the antrum S/P Bilroth II     RECOMMENDATIONS:     1) continue current medications     2) This may be as good as it gets as far as mucosal healing.     He seems ok from a clinical standpoint and adding sucralfate did     not seem to improve things much if at all.     3) I will let him know about biopsies.  ULCERATED SQUAMOUS MUCOSA.  NO INTESTINAL METAPLASIA, DYSPLASIA OR MALIGNANCY IDENTIFIED.   Appended Document: Upper Endoscopy CORRECTION: REV 1 year  Appended Document: Upper Endoscopy     Procedures Next Due Date:    EGD: 08/2011

## 2010-10-30 NOTE — Assessment & Plan Note (Signed)
Summary: MONTHLY B12/266.2//PT TO HAVE INJECTIONS MONTHLY//SP  Nurse Visit   Allergies: No Known Drug Allergies  Medication Administration  Injection # 1:    Medication: Vit B12 1000 mcg    Diagnosis: VITAMIN B12 DEFICIENCY (ICD-266.2)    Route: IM    Site: L deltoid    Exp Date: 06/2012    Lot #: [id number]    Mfr: [employer]    Comments: pt to schedule next monthly b 12 at front desk    Patient tolerated injection without complications    Given by: Chales Abrahams CMA Duncan Dull) (September 08, 2010 8:27 AM)  Orders Added: 1)  Vit B12 1000 mcg [J3420]

## 2010-11-07 ENCOUNTER — Encounter: Payer: Self-pay | Admitting: Internal Medicine

## 2010-11-07 ENCOUNTER — Encounter (INDEPENDENT_AMBULATORY_CARE_PROVIDER_SITE_OTHER): Payer: Medicare Other

## 2010-11-07 DIAGNOSIS — E538 Deficiency of other specified B group vitamins: Secondary | ICD-10-CM

## 2010-11-13 NOTE — Assessment & Plan Note (Signed)
Summary: MONTHLY B12.Marland KitchenMarland KitchenSHERI  Nurse Visit   Allergies: No Known Drug Allergies  Medication Administration  Injection # 1:    Medication: Vit B12 1000 mcg    Diagnosis: VITAMIN B12 DEFICIENCY (ICD-266.2)    Route: IM    Site: L deltoid    Exp Date: 07/2012    Lot #: [id number]    Mfr: [employer]    Patient tolerated injection without complications    Given by: Milford Cage NCMA (November 07, 2010 8:39 AM)  Orders Added: 1)  Vit B12 1000 mcg [J3420]

## 2010-12-08 ENCOUNTER — Encounter (INDEPENDENT_AMBULATORY_CARE_PROVIDER_SITE_OTHER): Payer: Medicare Other

## 2010-12-08 ENCOUNTER — Encounter: Payer: Self-pay | Admitting: Internal Medicine

## 2010-12-08 DIAGNOSIS — E538 Deficiency of other specified B group vitamins: Secondary | ICD-10-CM

## 2010-12-09 LAB — GLUCOSE, CAPILLARY
Glucose-Capillary: 143 mg/dL — ABNORMAL HIGH (ref 70–99)
Glucose-Capillary: 74 mg/dL (ref 70–99)
Glucose-Capillary: 97 mg/dL (ref 70–99)

## 2010-12-12 LAB — GLUCOSE, CAPILLARY
Glucose-Capillary: 117 mg/dL — ABNORMAL HIGH (ref 70–99)
Glucose-Capillary: 84 mg/dL (ref 70–99)

## 2010-12-14 LAB — BASIC METABOLIC PANEL
BUN: 16 mg/dL (ref 6–23)
BUN: 23 mg/dL (ref 6–23)
CO2: 21 mEq/L (ref 19–32)
CO2: 23 mEq/L (ref 19–32)
Calcium: 8.6 mg/dL (ref 8.4–10.5)
Chloride: 111 mEq/L (ref 96–112)
Chloride: 112 mEq/L (ref 96–112)
Creatinine, Ser: 1.25 mg/dL (ref 0.4–1.5)
Creatinine, Ser: 1.34 mg/dL (ref 0.4–1.5)
GFR calc Af Amer: >60 mL/min (ref >60)
GFR calc non Af Amer: 55 mL/min — ABNORMAL LOW (ref >60)
Glucose, Bld: 86 mg/dL (ref 70–99)
Glucose, Bld: 87 mg/dL (ref 70–99)
Potassium: 3.9 mEq/L (ref 3.5–5.1)
Potassium: 4.6 mEq/L (ref 3.5–5.1)

## 2010-12-14 LAB — CBC
HCT: 26.2 % — ABNORMAL LOW (ref 39.0–52.0)
HCT: 26.9 % — ABNORMAL LOW (ref 39.0–52.0)
HCT: 30.2 % — ABNORMAL LOW (ref 39.0–52.0)
Hemoglobin: 10.3 g/dL — ABNORMAL LOW (ref 13.0–17.0)
Hemoglobin: 9.2 g/dL — ABNORMAL LOW (ref 13.0–17.0)
MCHC: 34 g/dL (ref 30.0–36.0)
MCHC: 34.7 g/dL (ref 30.0–36.0)
MCV: 95.9 fL (ref 78.0–100.0)
MCV: 96.4 fL (ref 78.0–100.0)
MCV: 96.9 fL (ref 78.0–100.0)
Platelets: 195 10*3/uL (ref 150–400)
Platelets: 223 10*3/uL (ref 150–400)
Platelets: 235 10*3/uL (ref 150–400)
Platelets: 236 10*3/uL (ref 150–400)
RBC: 3.12 MIL/uL — ABNORMAL LOW (ref 4.22–5.81)
RBC: 3.18 MIL/uL — ABNORMAL LOW (ref 4.22–5.81)
RDW: 13.2 % (ref 11.5–15.5)
RDW: 13.3 % (ref 11.5–15.5)
RDW: 13.7 % (ref 11.5–15.5)
WBC: 4.2 10*3/uL (ref 4.0–10.5)
WBC: 5.9 10*3/uL (ref 4.0–10.5)
WBC: 7.4 10*3/uL (ref 4.0–10.5)
WBC: 7.5 10*3/uL (ref 4.0–10.5)

## 2010-12-14 LAB — URINALYSIS, ROUTINE W REFLEX MICROSCOPIC
Bilirubin Urine: NEGATIVE
Glucose, UA: NEGATIVE mg/dL
Hgb urine dipstick: NEGATIVE
Ketones, ur: NEGATIVE mg/dL
Nitrite: NEGATIVE
Protein, ur: NEGATIVE mg/dL
Specific Gravity, Urine: 1.016 (ref 1.005–1.030)
Urobilinogen, UA: 0.2 mg/dL (ref 0.0–1.0)
pH: 5.5 (ref 5.0–8.0)

## 2010-12-14 LAB — CROSSMATCH
ABO/RH(D): A POS
Antibody Screen: NEGATIVE

## 2010-12-14 LAB — DIFFERENTIAL
Basophils Absolute: 0 10*3/uL (ref 0.0–0.1)
Basophils Relative: 0 % (ref 0–1)
Eosinophils Absolute: 0.1 10*3/uL (ref 0.0–0.7)
Eosinophils Relative: 2 % (ref 0–5)
Lymphocytes Relative: 13 % (ref 12–46)
Lymphs Abs: 1 10*3/uL (ref 0.7–4.0)
Monocytes Absolute: 0.7 10*3/uL (ref 0.1–1.0)
Monocytes Relative: 10 % (ref 3–12)
Neutro Abs: 5.6 10*3/uL (ref 1.7–7.7)
Neutrophils Relative %: 76 % (ref 43–77)

## 2010-12-14 LAB — ABO/RH: ABO/RH(D): A POS

## 2010-12-14 LAB — COMPREHENSIVE METABOLIC PANEL
ALT: 19 U/L (ref 0–53)
AST: 27 U/L (ref 0–37)
Albumin: 3 g/dL — ABNORMAL LOW (ref 3.5–5.2)
Alkaline Phosphatase: 51 U/L (ref 39–117)
BUN: 27 mg/dL — ABNORMAL HIGH (ref 6–23)
CO2: 20 mEq/L (ref 19–32)
Calcium: 8.8 mg/dL (ref 8.4–10.5)
Chloride: 112 mEq/L (ref 96–112)
Creatinine, Ser: 1.4 mg/dL (ref 0.4–1.5)
GFR calc Af Amer: 59 mL/min — ABNORMAL LOW (ref >60)
GFR calc non Af Amer: 49 mL/min — ABNORMAL LOW (ref >60)
Glucose, Bld: 174 mg/dL — ABNORMAL HIGH (ref 70–99)
Potassium: 4.8 mEq/L (ref 3.5–5.1)
Sodium: 140 mEq/L (ref 135–145)
Total Bilirubin: 0.4 mg/dL (ref 0.3–1.2)
Total Protein: 5.6 g/dL — ABNORMAL LOW (ref 6.0–8.3)

## 2010-12-14 LAB — GLUCOSE, CAPILLARY
Glucose-Capillary: 151 mg/dL — ABNORMAL HIGH (ref 70–99)
Glucose-Capillary: 88 mg/dL (ref 70–99)

## 2010-12-14 LAB — T4, FREE: Free T4: 0.91 ng/dL (ref 0.80–1.80)

## 2010-12-14 LAB — FERRITIN: Ferritin: 34 ng/mL (ref 22–322)

## 2010-12-14 LAB — HEMOGLOBIN AND HEMATOCRIT, BLOOD
HCT: 27 % — ABNORMAL LOW (ref 39.0–52.0)
HCT: 30.2 % — ABNORMAL LOW (ref 39.0–52.0)
Hemoglobin: 10.3 g/dL — ABNORMAL LOW (ref 13.0–17.0)
Hemoglobin: 9.1 g/dL — ABNORMAL LOW (ref 13.0–17.0)

## 2010-12-14 LAB — PROTIME-INR
INR: 0.98 (ref 0.00–1.49)
Prothrombin Time: 12.9 seconds (ref 11.6–15.2)

## 2010-12-14 LAB — TSH: TSH: 2.868 u[IU]/mL (ref 0.350–4.500)

## 2010-12-14 LAB — FOLATE: Folate: >20 ng/mL

## 2010-12-14 LAB — CARDIAC PANEL(CRET KIN+CKTOT+MB+TROPI)
CK, MB: 2.2 ng/mL (ref 0.3–4.0)
CK, MB: 2.4 ng/mL (ref 0.3–4.0)
Total CK: 50 U/L (ref 7–232)
Troponin I: 0.02 ng/mL (ref 0.00–0.06)

## 2010-12-14 LAB — HEMOCCULT GUIAC POC 1CARD (OFFICE): Fecal Occult Bld: POSITIVE

## 2010-12-14 LAB — APTT: aPTT: 27 seconds (ref 24–37)

## 2010-12-14 LAB — IRON AND TIBC: UIBC: 208 ug/dL

## 2010-12-14 LAB — CK TOTAL AND CKMB (NOT AT ARMC)
CK, MB: 2.4 ng/mL (ref 0.3–4.0)
Relative Index: INVALID (ref 0.0–2.5)

## 2010-12-16 NOTE — Assessment & Plan Note (Signed)
Summary: monthly b12 shot  Nurse Visit   Allergies: No Known Drug Allergies  Medication Administration  Injection # 1:    Medication: Vit B12 1000 mcg    Diagnosis: VITAMIN B12 DEFICIENCY (ICD-266.2)    Route: IM    Site: L deltoid    Exp Date: 07/2012    Lot #: [id number]    Mfr: [employer]    Patient tolerated injection without complications    Given by: Harlow Mares CMA (AAMA) (December 08, 2010 9:07 AM)  Orders Added: 1)  Vit B12 1000 mcg [J3420]

## 2011-01-08 ENCOUNTER — Ambulatory Visit (INDEPENDENT_AMBULATORY_CARE_PROVIDER_SITE_OTHER): Payer: Medicare Other | Admitting: Internal Medicine

## 2011-01-08 DIAGNOSIS — E538 Deficiency of other specified B group vitamins: Secondary | ICD-10-CM

## 2011-01-08 MED ORDER — CYANOCOBALAMIN 1000 MCG/ML IJ SOLN: 1000.0000 ug | INTRAMUSCULAR | Status: DC

## 2011-01-08 MED ORDER — CYANOCOBALAMIN 1000 MCG/ML IJ SOLN
1000.0000 ug | INTRAMUSCULAR | Status: DC
  Administered 2011-01-08 – 2012-05-16 (×16): 1000 ug via INTRAMUSCULAR

## 2011-02-09 ENCOUNTER — Ambulatory Visit (INDEPENDENT_AMBULATORY_CARE_PROVIDER_SITE_OTHER): Payer: Medicare Other | Admitting: Internal Medicine

## 2011-02-09 DIAGNOSIS — E538 Deficiency of other specified B group vitamins: Secondary | ICD-10-CM

## 2011-02-09 MED ORDER — CYANOCOBALAMIN 1000 MCG/ML IJ SOLN: 1000.0000 ug | Freq: Once | INTRAMUSCULAR | Status: AC

## 2011-02-13 NOTE — Consult Note (Signed)
NAME:  Hector Gordon, Hector Gordon                           ACCOUNT NO.:  [id number]   MEDICAL RECORD NO.:  [id number]                   PATIENT TYPE:  OIB   LOCATION:  6525                                 FACILITY:  [facility]   PHYSICIAN:  Mikey Bussing, M.D.           DATE OF BIRTH:  11-17-1924   DATE OF CONSULTATION:  09/04/2003  DATE OF DISCHARGE:                                   CONSULTATION   CONSULTING PHYSICIAN:  Kerin Perna, M.D.   REFERRING PHYSICIAN:  Meade Maw, M.D.   PRIMARY CARE PHYSICIAN:  Hal T. Stoneking, M.D.   REASON FOR CONSULTATION:  Severe three vessel coronary artery disease with  class 3 angina.   CHIEF COMPLAINT:  Chest pain and shortness of breath.   HISTORY OF PRESENT ILLNESS:  I was asked to evaluate this 77 year old white  male diabetic for potential surgical coronary revascularization for recently  diagnosed severe three vessel coronary artery disease.  The patient has a  long history of cardiac murmur and mild aortic stenosis followed by serial  2D echoes.  He recently noted increasing dyspnea on exertion associated with  exertional substernal chest tightness radiating to the right arm.  This was  reproduced with several activities including walking his beagle dogs and  walking outside.  He was seen by Dr. Fraser Din for evaluation of his chest  pain and underwent a Cardiolite scan which was nonspecific.  Today he  underwent elective cardiac catheterization which showed significant coronary  artery disease.  His LAD had a 95% proximal stenosis with 80% stenosis of a  large diagonal.  The dominant circumflex had a proximal 60 to 70% stenosis  with stenosis of a posterolateral branch of 50%.  The right coronary artery  was small and non-dominant.  The left ventricular ejection fraction was  normal.  There is a transvalvular aortic mean gradient of 7 mmHg.  There is  no evidence of aortic insufficiency.  The patient was felt to be a candidate  for surgical  revascularization based on his severe coronary artery disease  and symptoms of class 3 exertional angina.   PAST MEDICAL HISTORY:  1. History of right body stroke seven years ago, probably hypertensive.  2. Hypertension.  3. Diabetes mellitus type 3.  4. Mild renal insufficiency with a baseline creatinine of 1.5.  5. 50% left carotid stenosis.  6. History of prostate cancer followed without treatment.  7. No known drug allergies.   CURRENT MEDICATIONS:  1. Zocor 10 mg p.o. daily.  2. Lisinopril 10 mg p.o. daily.  3. Diltiazem 180 mg p.o. daily.  4. Glipizide 5 mg p.o. daily.  5. Aspirin 81 mg p.o. daily.   PAST SURGICAL HISTORY:  1. Status post cholecystectomy.  2. 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  3. Skin cancers removed from his face.   SOCIAL HISTORY:  The patient denies smoking or alcohol use.  He  is married  and has several children and grandchildren.  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   FAMILY HISTORY:  Mother died of cancer and one sibling died of heart  failure.  Positive family history for diabetes.   REVIEW OF SYMPTOMS:  CONSTITUTIONAL:  He denies any constitutional symptoms  of fever, weight loss or night sweats.  ENT review is negative for a change  in vision or difficulty swallowing.  He sees a Education officer, community regularly.  PULMONARY:  Positive dyspnea on exertion.  Negative for a history of an  abnormal chest x-ray, chest trauma, productive cough or hemoptysis.  CARDIAC  REVIEW:  Positive for a long history of a cardiac murmur.  Negative history  of rheumatic heart disease and negative history for a prior MI.  GI:  Positive for laparotomy times two.  Negative for recent change in bowel  habits or blood per rectum.  GU:  Positive for prostate cancer followed  without therapy.  He states his last PSA was 9.0.  He is followed by a  urologist at [facility].  VASCULAR:  Negative for DVT, claudication or  TIA.  ENDOCRINE:  Positive for  diabetes.  Negative for thyroid disease.  HEMATOLOGIC:  Negative for blood transfusion or bleeding disorder.  NEUROLOGIC:  Positive for mild posterior circulation stroke with some mild  dizziness, now resolved.  No history of seizure.   PHYSICAL EXAMINATION:  VITAL SIGNS:  He is 5'5 and weighs 165 pounds, blood  pressure 165/78, heart rate 68, respirations 18, saturation 94% on room air.  GENERAL APPEARANCE:  Pleasant, elderly white male in his hospital room  following cardiac catheterization in no acute distress.  HEENT EXAM:  Normocephalic.  Full EOM's. Pharynx is clear.  Dentition is  under good repair.  NECK:  Without JVD, mass or carotid bruit.  LYMPHATICS:  No palpable supraclavicular or cervical adenopathy.  PULMONARY:  Clear breath sounds bilaterally and there is no thoracic  deformity.  CARDIAC EXAM:  Regular rhythm without an S3 gallop.  There is a grade 2/6  short systolic murmur at the right upper sternal border.  ABDOMINAL EXAM:  Soft with well healed surgical incisions.  There is no  abdominal mass or bruit.  EXTREMITIES:  No cyanosis, clubbing or edema.  VASCULAR EXAM:  2+ pulses in the radial, femoral and pedal areas.  His  Doppler exam reveals an ABI of 1.0 in each lower extremity.  There is no  evidence of venous insufficiency.  RECTAL EXAM:  Deferred.  SKIN:  Without rash or lesion.  NEUROLOGIC EXAM:  He is alert and oriented times three and he moves all  extremities well.   LABORATORY DATA:  Creatinine is 1.5.  His chest x-ray shows atherosclerotic  disease of  his aorta with cardiomegaly and clear lung fields.   I reviewed his coronary arteriograms with Dr. Fraser Din and agree with  interpretation of severe three vessel coronary artery disease.  His  hematocrit is 42%.   IMPRESSION AND PLAN:  The patient would benefit from surgical  revascularization.  Bypass grafts are planned to the left anterior descending, diagonal, circumflex marginal and posterior  descending arteries.  I have discussed the procedure with the patient and his wife including the  alternatives to surgical therapy.  Because his mean transvalvular aortic  gradient is only 7 mmHg, I would not recommend replacing his aortic valve at  this time which would be a prophylactic procedure in a 77 year old male with  prostate cancer and would add significant risk to the surgery.  I have  reviewed the major aspects of the procedure with the patient and his wife  including the location of the surgical incisions, the choice of conduits for  grafting, the use of general anesthesia and cardiopulmonary bypass and the  expected postoperative recovery.  I discussed with the patient the risks  associated with this operation including risks of myocardial infarction,  cerebrovascular accident, bleeding, blood transfusion requirement, infection  and death.  He understands these implications for the surgery and agrees to  proceed with the operation as planned under what I feel is an informed  consent.   Thank you very much for this consultation.                                               Mikey Bussing, M.D.    PV/MEDQ  D:  09/04/2003  T:  09/04/2003  Job:  [id number]   cc:   Meade Maw, M.D.  [address]  [place]  [place] [address]  Fax: [phone]   Hal T. Stoneking, M.D.  [address] [place], [place] [address]  Fax: [phone]

## 2011-02-13 NOTE — Op Note (Signed)
NAME:  Hector Gordon, Hector Gordon                           ACCOUNT NO.:  [id number]   MEDICAL RECORD NO.:  [id number]                   PATIENT TYPE:  OIB   LOCATION:  2311                                 FACILITY:  [facility]   PHYSICIAN:  Kerin Perna III, M.D.           DATE OF BIRTH:  1924-12-03   DATE OF PROCEDURE:  09/05/2003  DATE OF DISCHARGE:                                 OPERATIVE REPORT   PREOPERATIVE DIAGNOSIS:  Class 4 unstable angina with severe 3-vessel  coronary artery disease.   POSTOPERATIVE DIAGNOSIS:  Class 4 unstable angina with severe 3-vessel  coronary artery disease.   OPERATION:  Coronary artery bypass grafting x4 (left internal mammary artery  to the LAD, saphenous vein graft to the diagonal, saphenous vein graft to  the ramus intermediate, saphenous vein graft to the posterior descending  branch of the circumflex).   SURGEON:  Kerin Perna, M.D.   ASSISTANT:  Toribio Harbour, N.P.   ANESTHESIA:  General by Germaine Pomfret, M.D.   INDICATIONS FOR PROCEDURE:  The patient is a 77 year old non insulin  dependent diabetic who presents with progressive exertional chest pain and a  positive stress test. Cardiac catheterization demonstrated severe 3-vessel  coronary artery disease with a left dominant coronary circulation. He had a  high-grade 95% stenosis of the LAD diagonal, proximal 75% to 80% stenosis of  the OM1, ramus and 70% to 80% stenosis of the posterior descending branch of  the distal circumflex. He was felt to be a candidate for surgical  revascularization and I was asked to see the patient in consultation.   Prior to surgery I examined the patient in his hospital  room and reviewed  the results of the cardiac catheterization with the patient and his family.  I discussed  the indications and expected benefits of coronary artery bypass  graft surgery for treatment of his coronary artery disease. I reviewed with  the patient  and his family  the  major aspects of the proposed procedure,  including the choice of conduit for grafting, the location of the surgical  incisions, the use of general anesthesia and cardiopulmonary bypass and the  expected postoperative hospital  recovery. I discussed with the patient the  risks to him of coronary artery bypass surgery including  the risks of MI,  CVA, bleeding, infection, blood transfusion requirement and death. He  understood these implications for the surgery and agreed to proceed with the  operation as planned under what I felt was an informed consent.   FINDINGS:  The patient's saphenous vein was of suboptimal quality and vein  was harvested from both legs, both upper  and lower aspects. The mammary  artery was an excellent conduit. The coronaries were small. The main distal  circumflex marginal was not grafted because the stenosis was only 40% to 50%  and there was no adequate conduit to place an additional graft.  The  coronaries had severe proximal calcified disease. The patient was given a  unit of blood in the operating room for a hematocrit of 20% while on bypass.   DESCRIPTION OF PROCEDURE:  The patient was brought to the operating room and  placed on the operating room table where  general anesthesia was induced. An  attempt to do a transesophageal 2D echocardiogram was performed by the  anesthesiologist, however, due to mechanical problems getting the probe in  the esophagus, this was not performed. The patient was the prepped and  draped as a sterile field.   A sterile incision was made as the saphenous vein was harvested from the  lower extremities. An incision was first made in the right knee, however,  the vein was too small. An incision was then made in the left knee and the  endoscopic vein harvest was performed in the left thigh. However, this vein  was suboptimal and small in areas. The decision was then made to harvest  vein from the right lower leg in an open  technique  and this was performed.   The left internal mammary artery was harvested as a pedicle graft from its  origin at the subclavian vessels. It was a good vessel with excellent flow.  Heparin was administered and ACT was documented as being therapeutic. The  sternal retractor was placed and the pericardium was opened. Through  pursestrings placed in the ascending aorta and right atrium, the patient was  cannulated and placed on bypass and cooled to 32 degrees.   The coronaries were identified  for grafting. The mammary artery and vein  grafts were prepared for the distal  anastomoses. The vein grafts to the  ramus intermediate was very small as was the vein graft to the posterior  descending was slightly larger. The vein graft to the diagonal was  approximately 2.5 mm.   The patient was cooled to 30 degrees and the aortic cross clamp  was  applied. Then 750 mL of cold blood cardioplegia was delivered in the aortic  root with good cardioplegic arrest and a septal temperature dropping less  than 12 degrees. Topical iced saline was used to augment myocardial  preservation, and a pericardial insulator pad was used to protect the left  phrenic nerve.   The grafts to the distal  coronary anastomoses were then performed. The 1st  distal  anastomosis was to the diagonal. This was a 1.5-mm vessel with a  proximal 95% calcified lesion. A reverse saphenous vein was sewn end-to-side  with a running 7-0 Prolene with good flow through the graft.   The 2nd distal  anastomosis was the posterior descending branch of the  distal circumflex. This was a 1.5-mm vessel with proximal 75% to 80%  stenosis. A reverse saphenous vein was sewn end-to-side with running 7-0  Prolene with good flow through the graft. Cardioplegia was redosed.   The 3rd distal  anastomosis was the ramus intermediate. This was a 1.5-mm vessel with proximal 70% to 80% stenosis. A reverse saphenous vein (small  caliber) was  sewn end-to-side with a running 7-0 Prolene with adequate flow  through the  graft.   The 4th distal  anastomosis was to the distal 3rd to the LAD. This was a 1.5-  mm vessel with proximal 95% stenosis. The left internal mammary artery  pedicle was brought through an opening created in the left lateral  pericardium and was brought down under the LAD and sewn end-to-side with a  running 8-0 Prolene. There was  excellent flow through the anastomosis with  immediate rise in septal temperature after release of the pedicle clamp on  the mammary artery. The mammary artery graft resulted in spontaneous beating  of the heart. The cross clamp  was removed.   The heart resumed a spontaneous rhythm. It should be noted that the obtuse  marginal  of the heart  had a hematoma from the catheter at  the  ventriculogram during cardiac catheterization which produced injection into  the wall of the heart. There was no free perforation, but there was some  hematoma in that area. There was no bleeding in that area.   The vein grafts were then trimmed to the appropriate lengths and a partial  occluding clamp was placed on the ascending aorta. Three proximal vein  anastomoses were placed using a 4.0 mm puncture running 7-0 Prolene. The  proximal clamp was removed and the vein grafts were perfused. Each had  adequate flow and hemostasis was demonstrated at the proximal and distal  anastomoses.   The patient was rewarmed to 37 degrees. Temporary pacing wires were applied.  The lungs were expanded. The ventilator was resumed. The patient was  then  weaned  from bypass without inotropes. The patient required AV  sequential  pacing. Cardiac output and blood pressure were stable and Protamine was  administered without adverse reaction.   The cannulas were removed and the mediastinum was irrigated with warm  antibiotic irrigation. The leg incision was irrigated and closed in a  standard fashion. The superior   pericardial fat was closed over the aorta  and vein grafts. Two mediastinal and a left pleural chest tube were placed  and brought out thorough separate incisions. The sternum was closed with  interrupted steel wire. The pectoralis fascia was closed with a running #1  Vicryl. The subcutaneous and skin  were closed with a running Vicryl. Total  bypass time was 140 minutes with a cross clamp  time of 65 minutes.                                               Mikey Bussing, M.D.    PV/MEDQ  D:  09/05/2003  T:  09/06/2003  Job:  [id number]   cc:   Meade Maw, M.D.  [address]  [place]  [place] [address]  Fax: [phone]   Hal T. Stoneking, M.D.  [address]  [place], [place] [address]  Fax: [phone]   Minna Antis, M.D.  [facility]

## 2011-02-13 NOTE — Cardiovascular Report (Signed)
NAME:  Hector Gordon, Hector Gordon                           ACCOUNT NO.:  [id number]   MEDICAL RECORD NO.:  [id number]                   PATIENT TYPE:  OIB   LOCATION:  2856                                 FACILITY:  [facility]   PHYSICIAN:  Meade Maw, M.D.                 DATE OF BIRTH:  10-22-1924   DATE OF PROCEDURE:  09/04/2003  DATE OF DISCHARGE:                              CARDIAC CATHETERIZATION   REFERRING PHYSICIAN:  Hal T. Stoneking, M.D.   INDICATIONS FOR PROCEDURE:  Ongoing chest pain, known mild to moderate  calcific aortic stenosis.   PROCEDURE:  After obtaining written, informed consent the patient was  brought to the cardiac catheterization laboratory in the postabsorptive  state.  Preoperative sedation was achieved using IV Versed.  The right groin  was prepped and draped in the usual sterile fashion.  Local anesthesia was  achieved using 1% Xylocaine.  A 6-French hemostasis sheath was placed into  the right femoral artery using the modified Seldinger technique.  Selective  coronary angiography was performed using a JL4, JR4 Judkins catheter.  Single plane ventriculogram was performed in the RAO position using a 6-  [place] Feldman's catheter.  All catheter exchanges were made over a  guidewire.  The hemostasis sheath was placed following each engagement.  The  patient was then transferred to the holding area and the hemostasis sheath  was sewn into place.  He was given 2000 units of heparin until the films  could be reviewed by Lyn Records, M.D.   FINDINGS:  Aortic pressure was 158/63.  LV pressure is 165/13.  There was an  average gradient of 7.  EDP was 21.  Single plane ventriculogram revealed  normal wall motion, ejection fraction of 65%.  There was significant  myocardial staining with the Feldman's catheter.  There was no extrusion of  the contrast, however, into the pericardial space.  There were no  arrhythmias noted with injection.  Fluoroscopy revealed marked  calcification  of the proximal LAD and proximal circumflex.   CORONARY ANGIOGRAPHY:  The left main coronary artery bifurcates into the  left anterior descending and circumflex vessel.  There is luminal  irregularities in the left main coronary artery.   Left anterior descending gives rise to a trivial first diagonal, a large  bifurcating second diagonal.  There is a long 90% proximal lesion noted in  the LAD.  This is followed by a 70% napkin ring lesion after the second  large diagonal.  The distal LAD tapers to less than 1 mm vessel and has mild  diffuse disease.  The second diagonal is a large vessel.  It bifurcates.  There are luminal irregularities of up to 40-50% in the second diagonal.   The circumflex vessel is dominant for the posterior circulation.  It gives  rise to a trivial OM1, a larger OM2.  Following the larger OM2 there is a  40% lesion noted.  The circumflex then gives rise to a large PDA and a PL  branch.  The PL branch has a 40-50% lesion in it.   The right coronary artery is a small, nondominant vessel.  Has a long 80%  lesion.   FINAL IMPRESSION:  1. Critical disease in the proximal mid left anterior descending.  2. Moderate disease in the proximal circumflex.  3. Critical disease in the nondominant right.  4. Preserved left ventricular function.  5. Mild calcific aortic stenosis.  The films will be reviewed by Lyn Records, M.D.  Further recommendation pending his review.                                               Meade Maw, M.D.    HP/MEDQ  D:  09/04/2003  T:  09/04/2003  Job:  [id number]

## 2011-02-13 NOTE — Discharge Summary (Signed)
NAME:  Hector Gordon, Hector Gordon                           ACCOUNT NO.:  [id number]   MEDICAL RECORD NO.:  [id number]                   PATIENT TYPE:  INP   LOCATION:  2003                                 FACILITY:  [facility]   PHYSICIAN:  Kerin Perna, M.D.               DATE OF BIRTH:  December 31, 1924   DATE OF ADMISSION:  09/04/2003  DATE OF DISCHARGE:  09/11/2003                                 DISCHARGE SUMMARY   PRIMARY ADMITTING DIAGNOSIS:  Chest pain.   ADDITIONAL/DISCHARGE DIAGNOSES:  1. Severe three vessel coronary artery disease.  2. Class III angina.  3. History of right-sided cerebrovascular accident around seven years ago     probably secondary to hypertension.  4. Hypertension.  5. Type 2 non-insulin-dependent diabetes mellitus.  6. Mild renal insufficiency with baseline creatinine of 1.5.  7. A 50% left carotid stenosis.  8. History of prostate cancer which has been followed without treatment.  9. Postoperative tracheobronchitis.  10.      Postoperative anemia.  11.      Dyslipidemia.   PROCEDURE PERFORMED:  1. Cardiac catheterization.  2. Coronary artery bypass grafting x4 (left internal mammary artery to the     left anterior descending, saphenous vein graft to the diagonal, saphenous     vein graft to the ramus intermedius, saphenous vein graft to the     posterior descending).  3. Endoscopic vein harvest left thigh to mid calf and open vein harvest to     right lower leg.   HISTORY:  The patient is a 77 year old male who has a long history of a  cardiac murmur and mild aortic stenosis which has been followed by serial 2-  D echocardiograms.  He recently began to have increasing dyspnea on exertion  which was associated with substernal chest tightness.  The chest pain  radiated to his right arm and also occurred on exertion.  He was seen by  Meade Maw, M.D. for evaluation of his chest pain and underwent a  Cardiolite scan which was nonspecific.  Because of this it was  elected he  proceed with elective cardiac catheterization.   HOSPITAL COURSE:  He was brought into [facility] on September 04, 2003.  He underwent elective cardiac catheterization which showed  significant three vessel coronary artery disease including a 95% proximal  LAD stenosis, 80% diagonal stenosis, 60-70% proximal circumflex stenosis,  and 50% posterolateral stenosis.  He had a normal left ventricular ejection  fraction.  There was no evidence of aortic insufficiency.  Because of his  significant disease he was not felt to be a good candidate for percutaneous  intervention.  A cardiothoracic surgery consultation was obtained and Kerin Perna, M.D. saw the patient.  After review of his films it was agreed  that his best course of action would be to proceed with surgical  revascularization.  After an explanation of  the risks, benefits, and  alternatives, the patient agreed to proceed.  He was taken to the operating  room on December 8 and underwent CABG x4 as described in detail above by  Kerin Perna, M.D.  He tolerated the procedure well and was transferred  to the SICU in stable condition.  He remained on the ventilator until  postoperative day one at which time he was slowly weaned and extubated.  He  was kept in the ICU for further observation but his hemodynamic monitoring  devices and chest tubes were removed.  He was mobilized.  By postoperative  day two he was ready for transfer to the floor.  He initially was maintained  on a Glucommander protocol and as his p.o. intake improved he was switched  to Lantus and sliding scale insulin for his diabetes coverage.  His  creatinine remained stable at 1.5 which was right around his baseline.  He  developed productive cough with discolored sputum.  He also remained  dependent on supplemental oxygen up to 2-3 L.  He was started on Ceftin for  presumed tracheobronchitis and he was treated with nebulizers and aggressive   pulmonary toilet measures.  As his p.o. intake improved he was restarted on  his home diabetes medications which are Glucophage and Glipizide.  Overall,  he has done well postoperatively.  He has been weaned down to 1 L of oxygen  at present and is maintaining O2 saturations of around 93-95%.  His cough  has improved and his chest is clear to examination.  He has been diuresed  back down to within about 5 pounds of his preoperative weight, but is still  somewhat edematous.  He has remained afebrile and all vital signs are  stable.  He has been normal sinus rhythm postoperatively.  He has been  ambulating in the halls with cardiac rehabilitation phase I and is doing  well.  His laboratories have remained stable with a hemoglobin of 8.6,  hematocrit 25.8, white count 6.2, platelets 246,000, BUN 19, creatinine 1.3.  His sugars have been fairly well controlled on his home medications.  It is  felt that if he continues to remain stable from a pulmonary standpoint and  can be weaned completely off oxygen he may be ready for discharge home by  September 12, 2003.   DISCHARGE MEDICATIONS:  1. Enteric-coated aspirin 325 mg daily.  2. Ceftin 250 mg b.i.d. x5 days.  3. Lasix 40 mg daily x1 week.  4. K-Dur 20 mEq daily x1 week.  5. Combivent two puffs q.i.d.  6. Lisinopril 10 mg daily.  7. Diltiazem CD 180 mg daily.  8. Simvastatin 10 mg q.h.s.  9. Glipizide 10 mg daily.  10.      Metformin 500 mg daily.  11.      Fish oil 1000 mg two tablets daily.  12.      Vitamin E 800 mg two tablets daily.  13.      Ultram 50-100 mg q.4h. p.r.n. for pain or Tylenol extra strength     one to two q.4h. p.r.n. for pain.   DISCHARGE INSTRUCTIONS:  He is to refrain from driving, heavy lifting, or  strenuous activity.  He may continue daily ambulation and use of his  incentive spirometer.  He will continue a low fat, low sodium diet.  He is asked to shower daily and clean his incisions with soap and water.    DISCHARGE FOLLOWUP:  He is asked to make an  appointment to see Meade Maw, M.D. in two weeks and have a chest x-ray at that visit.  He will  then follow up with Kerin Perna, M.D. on January 7 at 12 p.m.  He is  asked to bring his chest x-ray to this appointment for Kerin Perna, M.D.  to review.  He is also asked to keep a log of his blood sugars and to follow  up with Hal T. Stoneking, M.D. regarding this.  He will call our office in  the interim if he experiences any problems or has questions.      Coral Ceo, P.A.                        Kerin Perna, M.D.    GC/MEDQ  D:  09/11/2003  T:  09/11/2003  Job:  [id number]   cc:   Hal T. Stoneking, M.D.  [address]  [place], [place] [address]  Fax: [phone]   Meade Maw, M.D.  [address]  [place]  [place] [address]  Fax: [phone]

## 2011-03-16 ENCOUNTER — Ambulatory Visit (INDEPENDENT_AMBULATORY_CARE_PROVIDER_SITE_OTHER): Payer: Medicare Other | Admitting: Internal Medicine

## 2011-03-16 DIAGNOSIS — E538 Deficiency of other specified B group vitamins: Secondary | ICD-10-CM

## 2011-04-13 ENCOUNTER — Ambulatory Visit (INDEPENDENT_AMBULATORY_CARE_PROVIDER_SITE_OTHER): Payer: Medicare Other | Admitting: Internal Medicine

## 2011-04-13 DIAGNOSIS — E538 Deficiency of other specified B group vitamins: Secondary | ICD-10-CM

## 2011-05-15 ENCOUNTER — Ambulatory Visit: Payer: Medicare Other | Admitting: Internal Medicine

## 2011-05-15 DIAGNOSIS — E538 Deficiency of other specified B group vitamins: Secondary | ICD-10-CM

## 2011-06-15 ENCOUNTER — Ambulatory Visit (INDEPENDENT_AMBULATORY_CARE_PROVIDER_SITE_OTHER): Payer: Medicare Other | Admitting: Internal Medicine

## 2011-06-15 DIAGNOSIS — E538 Deficiency of other specified B group vitamins: Secondary | ICD-10-CM

## 2011-07-08 ENCOUNTER — Encounter: Payer: Self-pay | Admitting: Internal Medicine

## 2011-07-17 ENCOUNTER — Ambulatory Visit (INDEPENDENT_AMBULATORY_CARE_PROVIDER_SITE_OTHER): Payer: Medicare Other | Admitting: Internal Medicine

## 2011-07-17 ENCOUNTER — Encounter: Payer: Self-pay | Admitting: Internal Medicine

## 2011-07-17 DIAGNOSIS — E538 Deficiency of other specified B group vitamins: Secondary | ICD-10-CM

## 2011-07-17 NOTE — Progress Notes (Signed)
[birth year] [id number]   77 yo wm with severe GERD s/p BII gastrectomy and B12 deficiency. Here for routine f/u. No dysphagia, vomiting. Still goes to [place] and Dr. Pete Glatter. Labs followed at those places of care.  Past Medical History  Diagnosis Date  . GERD (gastroesophageal reflux disease)   . Hiatal hernia   . HTN (hypertension)   . DM (diabetes mellitus)   . Dyslipidemia   . H/O: GI bleed   . Hypotension   . Prostate cancer   . CAD (coronary artery disease)    Past Surgical History  Procedure Date  . Coronary artery bypass graft   . Cholecystectomy   . Stomach surgery     removed 65%  . Billroth ii   . Upper gastrointestinal endoscopy 09/02/10    w/bx, Ulcerative esophagitis    reports that he quit smoking about 36 years ago. He has never used smokeless tobacco. He reports that he drinks alcohol. He reports that he does not use illicit drugs. family history is not on file. No Known Allergies    Current outpatient prescriptions:amLODipine (NORVASC) 10 MG tablet, Take 10 mg by mouth daily.  , Disp: , Rfl: ;  aspirin 81 MG tablet, Take 81 mg by mouth daily.  , Disp: , Rfl: ;  ferrous sulfate 325 (65 FE) MG tablet, Take 325 mg by mouth daily with breakfast.  , Disp: , Rfl: ;  gemfibrozil (LOPID) 600 MG tablet, Take 600 mg by mouth 2 (two) times daily.  , Disp: , Rfl:  glipiZIDE (GLUCOTROL) 10 MG tablet, Take 10 mg by mouth 2 (two) times daily before a meal.  , Disp: , Rfl: ;  hydrALAZINE (APRESOLINE) 10 MG tablet, Take 10 mg by mouth 2 (two) times daily.  , Disp: , Rfl: ;  lisinopril (PRINIVIL,ZESTRIL) 20 MG tablet, Take 20 mg by mouth daily.  , Disp: , Rfl: ;  metFORMIN (GLUCOPHAGE) 500 MG tablet, Take 500 mg by mouth 2 (two) times daily with a meal.  , Disp: , Rfl:  metoprolol (TOPROL-XL) 50 MG 24 hr tablet, Take 50 mg by mouth daily.  , Disp: , Rfl: ;  Multiple Vitamins-Minerals (CENTRUM SILVER PO), Take 1 tablet by mouth daily.  , Disp: , Rfl: ;  Omega-3 Fatty Acids (FISH OIL)  1000 MG CAPS, Take 2 capsules by mouth daily.  , Disp: , Rfl: ;  omeprazole (PRILOSEC) 40 MG capsule, Take 40 mg by mouth 2 (two) times daily.  , Disp: , Rfl:  simvastatin (ZOCOR) 40 MG tablet, Take 40 mg by mouth daily.  , Disp: , Rfl: ;  sucralfate (CARAFATE) 1 G tablet, Take 1 g by mouth 4 (four) times daily -  with meals and at bedtime.  , Disp: , Rfl: ;  Tamsulosin HCl (FLOMAX) 0.4 MG CAPS, Take 0.4 mg by mouth daily.  , Disp: , Rfl:  Current facility-administered medications:cyanocobalamin ((VITAMIN B-12)) injection 1,000 mcg, 1,000 mcg, Intramuscular, Q30 days, Iva Boop, MD, 1,000 mcg at 06/15/11 [phone]

## 2011-07-17 NOTE — Assessment & Plan Note (Signed)
Continue monthly B12 injections here in [facility] (his preference).

## 2011-07-17 NOTE — Assessment & Plan Note (Signed)
Continue PPI and carafate. He is asymptomatic.

## 2011-07-17 NOTE — Patient Instructions (Addendum)
You have been given your Vitamin B12 injection today and we will continue monthly B12 injections. Stop by the front desk to schedule your injection for next month. Please don't forget to get your flu shot with Dr. Pete Glatter before the end of November. Please return to see Dr. Leone Payor in 1 year.

## 2011-08-18 ENCOUNTER — Ambulatory Visit (INDEPENDENT_AMBULATORY_CARE_PROVIDER_SITE_OTHER): Payer: Medicare Other | Admitting: Internal Medicine

## 2011-08-18 DIAGNOSIS — E538 Deficiency of other specified B group vitamins: Secondary | ICD-10-CM

## 2011-09-21 ENCOUNTER — Ambulatory Visit (INDEPENDENT_AMBULATORY_CARE_PROVIDER_SITE_OTHER): Payer: Medicare Other | Admitting: Internal Medicine

## 2011-09-21 DIAGNOSIS — E538 Deficiency of other specified B group vitamins: Secondary | ICD-10-CM

## 2011-10-23 ENCOUNTER — Ambulatory Visit (INDEPENDENT_AMBULATORY_CARE_PROVIDER_SITE_OTHER): Payer: Medicare Other | Admitting: Internal Medicine

## 2011-10-23 DIAGNOSIS — E538 Deficiency of other specified B group vitamins: Secondary | ICD-10-CM

## 2011-11-13 ENCOUNTER — Ambulatory Visit (INDEPENDENT_AMBULATORY_CARE_PROVIDER_SITE_OTHER): Payer: Medicare Other | Admitting: Internal Medicine

## 2011-11-13 DIAGNOSIS — E538 Deficiency of other specified B group vitamins: Secondary | ICD-10-CM

## 2011-12-31 ENCOUNTER — Other Ambulatory Visit: Payer: Self-pay | Admitting: Geriatric Medicine

## 2012-01-11 ENCOUNTER — Ambulatory Visit (INDEPENDENT_AMBULATORY_CARE_PROVIDER_SITE_OTHER): Payer: Medicare Other | Admitting: Internal Medicine

## 2012-01-11 DIAGNOSIS — E538 Deficiency of other specified B group vitamins: Secondary | ICD-10-CM

## 2012-02-11 ENCOUNTER — Ambulatory Visit (INDEPENDENT_AMBULATORY_CARE_PROVIDER_SITE_OTHER): Payer: Medicare Other | Admitting: Internal Medicine

## 2012-02-11 DIAGNOSIS — E538 Deficiency of other specified B group vitamins: Secondary | ICD-10-CM

## 2012-03-15 ENCOUNTER — Ambulatory Visit (INDEPENDENT_AMBULATORY_CARE_PROVIDER_SITE_OTHER): Payer: Medicare Other | Admitting: Internal Medicine

## 2012-03-15 DIAGNOSIS — E538 Deficiency of other specified B group vitamins: Secondary | ICD-10-CM

## 2012-04-14 ENCOUNTER — Ambulatory Visit (INDEPENDENT_AMBULATORY_CARE_PROVIDER_SITE_OTHER): Payer: Medicare Other | Admitting: Internal Medicine

## 2012-04-14 DIAGNOSIS — E538 Deficiency of other specified B group vitamins: Secondary | ICD-10-CM

## 2012-05-16 ENCOUNTER — Ambulatory Visit (INDEPENDENT_AMBULATORY_CARE_PROVIDER_SITE_OTHER): Payer: Medicare Other | Admitting: Gastroenterology

## 2012-05-16 DIAGNOSIS — E538 Deficiency of other specified B group vitamins: Secondary | ICD-10-CM

## 2012-06-16 ENCOUNTER — Ambulatory Visit (INDEPENDENT_AMBULATORY_CARE_PROVIDER_SITE_OTHER): Payer: Medicare Other | Admitting: Internal Medicine

## 2012-06-16 DIAGNOSIS — E538 Deficiency of other specified B group vitamins: Secondary | ICD-10-CM

## 2012-06-16 MED ORDER — CYANOCOBALAMIN 1000 MCG/ML IJ SOLN
1000.0000 ug | Freq: Once | INTRAMUSCULAR | Status: AC
  Administered 2012-06-16: 1000 ug via INTRAMUSCULAR

## 2012-07-15 ENCOUNTER — Encounter: Payer: Self-pay | Admitting: Internal Medicine

## 2012-07-18 ENCOUNTER — Ambulatory Visit (INDEPENDENT_AMBULATORY_CARE_PROVIDER_SITE_OTHER): Payer: Medicare Other | Admitting: Internal Medicine

## 2012-07-18 DIAGNOSIS — E538 Deficiency of other specified B group vitamins: Secondary | ICD-10-CM

## 2012-07-18 MED ORDER — CYANOCOBALAMIN 1000 MCG/ML IJ SOLN
1000.0000 ug | Freq: Once | INTRAMUSCULAR | Status: AC
  Administered 2012-07-18: 1000 ug via INTRAMUSCULAR

## 2012-08-23 ENCOUNTER — Encounter: Payer: Self-pay | Admitting: Internal Medicine

## 2012-08-23 ENCOUNTER — Ambulatory Visit (INDEPENDENT_AMBULATORY_CARE_PROVIDER_SITE_OTHER): Payer: Medicare Other | Admitting: Internal Medicine

## 2012-08-23 VITALS — BP 130/78 | HR 57 | Ht 65.0 in | Wt 161.4 lb

## 2012-08-23 DIAGNOSIS — E538 Deficiency of other specified B group vitamins: Secondary | ICD-10-CM

## 2012-08-23 DIAGNOSIS — K219 Gastro-esophageal reflux disease without esophagitis: Secondary | ICD-10-CM

## 2012-08-23 MED ORDER — CYANOCOBALAMIN 1000 MCG/ML IJ SOLN
1000.0000 ug | Freq: Once | INTRAMUSCULAR | Status: AC
  Administered 2012-08-23: 1000 ug via INTRAMUSCULAR

## 2012-08-23 NOTE — Patient Instructions (Addendum)
You have been given a B-12 injection today.  Follow up with [place] in a year or sooner if needed and we will see you monthly for your B-12 injections.  Thank you for choosing me and [facility].  Iva Boop, M.D., [facility]

## 2012-08-23 NOTE — Progress Notes (Signed)
  Subjective:    Patient ID: Hector Gordon, male    DOB: 1924-12-24, 78 y.o.   MRN: [id number]  HPI Hector Gordon returns for followup of severe reflux disease and B12 deficiency. He is doing well. He underwent an aortic valve replacement at [facility], he is in the [facility] program doing cardiac rehabilitation. He has no specific complaints today. He would like to get his monthly B12 injection, and off for the chance to go to oral supplementation he prefers monthly IM injections. Medications, allergies, past medical history, past surgical history, family history and social history are reviewed and updated in the EMR.  Review of Systems As above    Objective:   Physical Exam Elderly white man in no acute distress       Assessment & Plan:   1. GERD (gastroesophageal reflux disease)   2. B12 deficiency    1. It looks like he is omeprazole has been reduced to 20 mg twice a day down from 40 mg. He is no longer on Carafate. 2. And she is not symptomatic I am inclined to leave things alone 3. Continue monthly B12 injections 4. Return visit in 1 year sooner if needed  CC: Hector Otto, MD

## 2012-09-26 ENCOUNTER — Ambulatory Visit (INDEPENDENT_AMBULATORY_CARE_PROVIDER_SITE_OTHER): Payer: Medicare Other | Admitting: Internal Medicine

## 2012-09-26 DIAGNOSIS — E538 Deficiency of other specified B group vitamins: Secondary | ICD-10-CM

## 2012-09-26 MED ORDER — CYANOCOBALAMIN 1000 MCG/ML IJ SOLN
1000.0000 ug | INTRAMUSCULAR | Status: AC
  Administered 2012-09-26 – 2012-10-27 (×2): 1000 ug via INTRAMUSCULAR

## 2012-10-27 ENCOUNTER — Ambulatory Visit (INDEPENDENT_AMBULATORY_CARE_PROVIDER_SITE_OTHER): Payer: Medicare Other | Admitting: Internal Medicine

## 2012-10-27 DIAGNOSIS — E538 Deficiency of other specified B group vitamins: Secondary | ICD-10-CM

## 2013-01-30 ENCOUNTER — Other Ambulatory Visit: Payer: Self-pay | Admitting: Geriatric Medicine

## 2013-01-30 DIAGNOSIS — R269 Unspecified abnormalities of gait and mobility: Secondary | ICD-10-CM

## 2013-01-31 ENCOUNTER — Ambulatory Visit
Admission: RE | Admit: 2013-01-31 | Discharge: 2013-01-31 | Disposition: A | Discharge status: Home / Self Care | Payer: Medicare Other | Source: Ambulatory Visit | Attending: Geriatric Medicine | Admitting: Geriatric Medicine

## 2013-01-31 DIAGNOSIS — R269 Unspecified abnormalities of gait and mobility: Secondary | ICD-10-CM

## 2013-02-10 ENCOUNTER — Inpatient Hospital Stay (HOSPITAL_BASED_OUTPATIENT_CLINIC_OR_DEPARTMENT_OTHER)
Admission: EM | Admit: 2013-02-10 | Discharge: 2013-02-13 | DRG: 149 | Disposition: A | Discharge status: Home / Self Care | Payer: Medicare Other | Attending: Internal Medicine | Admitting: Internal Medicine

## 2013-02-10 ENCOUNTER — Encounter (HOSPITAL_BASED_OUTPATIENT_CLINIC_OR_DEPARTMENT_OTHER): Payer: Self-pay | Admitting: *Deleted

## 2013-02-10 DIAGNOSIS — R799 Abnormal finding of blood chemistry, unspecified: Secondary | ICD-10-CM

## 2013-02-10 DIAGNOSIS — E785 Hyperlipidemia, unspecified: Secondary | ICD-10-CM | POA: Diagnosis present

## 2013-02-10 DIAGNOSIS — R739 Hyperglycemia, unspecified: Secondary | ICD-10-CM

## 2013-02-10 DIAGNOSIS — I2510 Atherosclerotic heart disease of native coronary artery without angina pectoris: Secondary | ICD-10-CM | POA: Diagnosis present

## 2013-02-10 DIAGNOSIS — K2100 Gastro-esophageal reflux disease with esophagitis, without bleeding: Secondary | ICD-10-CM | POA: Diagnosis present

## 2013-02-10 DIAGNOSIS — Z951 Presence of aortocoronary bypass graft: Secondary | ICD-10-CM

## 2013-02-10 DIAGNOSIS — I129 Hypertensive chronic kidney disease with stage 1 through stage 4 chronic kidney disease, or unspecified chronic kidney disease: Secondary | ICD-10-CM | POA: Diagnosis present

## 2013-02-10 DIAGNOSIS — E538 Deficiency of other specified B group vitamins: Secondary | ICD-10-CM

## 2013-02-10 DIAGNOSIS — N189 Chronic kidney disease, unspecified: Secondary | ICD-10-CM | POA: Diagnosis present

## 2013-02-10 DIAGNOSIS — R7989 Other specified abnormal findings of blood chemistry: Secondary | ICD-10-CM

## 2013-02-10 DIAGNOSIS — N179 Acute kidney failure, unspecified: Secondary | ICD-10-CM | POA: Diagnosis present

## 2013-02-10 DIAGNOSIS — T465X5A Adverse effect of other antihypertensive drugs, initial encounter: Secondary | ICD-10-CM | POA: Diagnosis present

## 2013-02-10 DIAGNOSIS — Z85118 Personal history of other malignant neoplasm of bronchus and lung: Secondary | ICD-10-CM

## 2013-02-10 DIAGNOSIS — K449 Diaphragmatic hernia without obstruction or gangrene: Secondary | ICD-10-CM | POA: Diagnosis present

## 2013-02-10 DIAGNOSIS — E119 Type 2 diabetes mellitus without complications: Secondary | ICD-10-CM | POA: Diagnosis present

## 2013-02-10 DIAGNOSIS — Z954 Presence of other heart-valve replacement: Secondary | ICD-10-CM

## 2013-02-10 DIAGNOSIS — E869 Volume depletion, unspecified: Secondary | ICD-10-CM | POA: Diagnosis present

## 2013-02-10 DIAGNOSIS — I951 Orthostatic hypotension: Secondary | ICD-10-CM | POA: Diagnosis present

## 2013-02-10 DIAGNOSIS — R42 Dizziness and giddiness: Principal | ICD-10-CM | POA: Diagnosis present

## 2013-02-10 LAB — URINALYSIS, ROUTINE W REFLEX MICROSCOPIC
Bilirubin Urine: NEGATIVE
Glucose, UA: 500 mg/dL — AB
Ketones, ur: NEGATIVE mg/dL
pH: 7 (ref 5.0–8.0)

## 2013-02-10 LAB — COMPREHENSIVE METABOLIC PANEL
AST: 34 U/L (ref 0–37)
Albumin: 3.8 g/dL (ref 3.5–5.2)
Alkaline Phosphatase: 28 U/L — ABNORMAL LOW (ref 39–117)
BUN: 46 mg/dL — ABNORMAL HIGH (ref 6–23)
CO2: 28 mEq/L (ref 19–32)
Chloride: 94 mEq/L — ABNORMAL LOW (ref 96–112)
Potassium: 4 mEq/L (ref 3.5–5.1)
Total Bilirubin: 0.4 mg/dL (ref 0.3–1.2)

## 2013-02-10 LAB — CBC WITH DIFFERENTIAL/PLATELET
Basophils Relative: 1 % (ref 0–1)
Hemoglobin: 11.3 g/dL — ABNORMAL LOW (ref 13.0–17.0)
Lymphocytes Relative: 22 % (ref 12–46)
MCHC: 34.9 g/dL (ref 30.0–36.0)
Monocytes Relative: 16 % — ABNORMAL HIGH (ref 3–12)
Neutro Abs: 3 10*3/uL (ref 1.7–7.7)
Neutrophils Relative %: 56 % (ref 43–77)
RBC: 3.45 MIL/uL — ABNORMAL LOW (ref 4.22–5.81)
WBC: 5.3 10*3/uL (ref 4.0–10.5)

## 2013-02-10 LAB — URINE MICROSCOPIC-ADD ON

## 2013-02-10 LAB — GLUCOSE, CAPILLARY: Glucose-Capillary: 163 mg/dL — ABNORMAL HIGH (ref 70–99)

## 2013-02-10 LAB — TROPONIN I: Troponin I: <0.3 ng/mL (ref <0.30)

## 2013-02-10 MED ORDER — SODIUM CHLORIDE 0.9 % IV BOLUS (SEPSIS)
500.0000 mL | Freq: Once | INTRAVENOUS | Status: AC
  Administered 2013-02-10: 19:00:00 via INTRAVENOUS

## 2013-02-10 NOTE — ED Notes (Signed)
Care giver reports increased BP and unsteady gait x 1 day, BP med d/c wed at PMD office

## 2013-02-10 NOTE — ED Provider Notes (Signed)
History     CSN: [id number]  Arrival date & time 02/10/13  1411   First MD Initiated Contact with Patient 02/10/13 1612      Chief Complaint  Patient presents with  . Hypertension    (Consider location/radiation/quality/duration/timing/severity/associated sxs/prior treatment) HPI Comments: Hector Gordon is a 79 y/o M with PMHx of prostate cancer, GERD, HTN, DM, dyslipidemia, CAD presenting to the ED, with friend and family, with dizziness that has been ongoing for the past 2 months. Patient has been being followed up with ear physician, Dr. Hyacinth Meeker ruled out vertigo. MRI performed on 01/31/2013 - ruled out stroke effect. Patient was seen by Dr. Evette Cristal [facility] in [place] - Doxazosin was discontinued due to thinking that this could be the factor leading to the blood pressure being uncontrolled. Wife reported that patient had a fall approximately 4 weeks ago, stated that patient was going for the mail, mail dropped on the floor and when patient went to go and grab it he ended up falling backwards and landed on his head. Patient reported that he felt fine, denied pain or confusion - denied being followed up by hospital or physician. MRI performed on 01/31/2013 - negative stroke findings. Wife reported that patient woke up this morning cold, shaking, and feeling light-headed. Blood pressure was taken at approximately 12:30PM - sitting was 153/59 and standing was 129/64 - as per wife recordings. Patient reported that he feels unsteady - reported that every time he stands up he feels like he is going to fall because he does not have the greatest balance. Denied chest pain, shortness of breathe, difficulty breathing, gi symptoms, urinary symptoms, fatigue, fever, chills.  Patient lives on his own. PCP: Dr. Pete Gordon     The history is provided by the patient, a friend and a relative. No language interpreter was used.    Past Medical History  Diagnosis Date  . GERD (gastroesophageal reflux disease)    . Hiatal hernia   . HTN (hypertension)   . DM (diabetes mellitus)   . Dyslipidemia   . H/O: GI bleed   . Hypotension   . Prostate cancer   . CAD (coronary artery disease)     Past Surgical History  Procedure Laterality Date  . Coronary artery bypass graft    . Cholecystectomy    . Stomach surgery      removed 65%  . Billroth ii    . Upper gastrointestinal endoscopy  09/02/10    w/bx, Ulcerative esophagitis  . Cardiac valve replacement  10/2010    History reviewed. No pertinent family history.  History  Substance Use Topics  . Smoking status: Former Smoker    Quit date: 09/28/1974  . Smokeless tobacco: Never Used  . Alcohol Use: No      Review of Systems  Constitutional: Negative for fever, chills and fatigue.  HENT: Negative for ear pain, congestion, sore throat, rhinorrhea, trouble swallowing, neck pain and neck stiffness.   Eyes: Negative for photophobia, pain and visual disturbance.  Respiratory: Negative for cough, chest tightness and shortness of breath.   Cardiovascular: Negative for chest pain.  Gastrointestinal: Negative for nausea, vomiting, abdominal pain, diarrhea and constipation.  Genitourinary: Negative for dysuria, hematuria, decreased urine volume and difficulty urinating.  Musculoskeletal: Negative for back pain and arthralgias.  Skin: Negative for rash and wound.  Neurological: Positive for dizziness. Negative for weakness, light-headedness, numbness and headaches.  All other systems reviewed and are negative.    Allergies  Review of  patient's allergies indicates no known allergies.  Home Medications   No current outpatient prescriptions on file.  BP 165/44  Pulse 65  Temp(Src) 97.7 F (36.5 C) (Oral)  Resp 16  Ht 5\' 5"  (1.651 m)  Wt 147 lb 8 oz (66.906 kg)  BMI 24.55 kg/m2  SpO2 98%  Physical Exam  Nursing note and vitals reviewed. Constitutional: He is oriented to person, place, and time. He appears well-developed and  well-nourished. No distress.  HENT:  Head: Normocephalic and atraumatic.  Mouth/Throat: Oropharynx is clear and moist. No oropharyngeal exudate.  Uvula midline, symmetrical elevation  Eyes: Conjunctivae and EOM are normal. Pupils are equal, round, and reactive to light. Right eye exhibits no discharge. Left eye exhibits no discharge.  Neck: Normal range of motion. Neck supple. No tracheal deviation present.  Negative nuchal rigidity Negative neck stiffness Negative lymphadenopathy  Cardiovascular: Normal rate, regular rhythm and normal heart sounds.  Exam reveals no friction rub.   No murmur heard. Radial pulses 2+ bilaterally Pedal pulses 2+ bilaterally Negative leg and ankle swelling Negative pitting edema  Pulmonary/Chest: Effort normal and breath sounds normal. No respiratory distress. He has no wheezes. He has no rales.  Musculoskeletal: Normal range of motion. He exhibits no edema and no tenderness.  Strength 5+/5+ to upper and lower extremities bilaterally  Lymphadenopathy:    He has no cervical adenopathy.  Neurological: He is alert and oriented to person, place, and time. No cranial nerve deficit. He exhibits normal muscle tone. Coordination normal.  Cranial nerves III-XII grossly intact Sensation present to upper and lower extremities bilaterally   Skin: Skin is warm and dry. No rash noted. He is not diaphoretic. No erythema.  Psychiatric: He has a normal mood and affect. His behavior is normal. Thought content normal.    ED Course  Procedures (including critical care time)  Orthostatics: laying = 169/47, 55 bpm Sitting 150/57, 58 bpm Standing 141/4, 59 bpm    Date: 02/10/2013  Rate: 61  Rhythm: normal sinus rhythm  QRS Axis: left  Intervals: PR prolonged  ST/T Wave abnormalities: nonspecific ST/T changes  Conduction Disutrbances:first-degree A-V block   Narrative Interpretation:   Old EKG Reviewed: unchanged  7:03PM patient seen by Dr. Rosalia Hammers  7:38PM Dr.  Nehemiah Settle, physician on call at [facility], office is closed and unable to identify lab results to compare the BUN and Cr levels.  Family noticed that patient was taken on off blood pressure medication, Doxazosin, and diabetes medication, Glipizide x 2 months ago.     Labs Reviewed  CBC WITH DIFFERENTIAL - Abnormal; Notable for the following:    RBC 3.45 (*)    Hemoglobin 11.3 (*)    HCT 32.4 (*)    Monocytes Relative 16 (*)    All other components within normal limits  COMPREHENSIVE METABOLIC PANEL - Abnormal; Notable for the following:    Chloride 94 (*)    Glucose, Bld 373 (*)    BUN 46 (*)    Creatinine, Ser 2.50 (*)    Alkaline Phosphatase 28 (*)    GFR calc non Af Amer 22 (*)    GFR calc Af Amer 25 (*)    All other components within normal limits  URINALYSIS, ROUTINE W REFLEX MICROSCOPIC - Abnormal; Notable for the following:    Glucose, UA 500 (*)    Protein, ur 30 (*)    All other components within normal limits  GLUCOSE, CAPILLARY - Abnormal; Notable for the following:    Glucose-Capillary 163 (*)  All other components within normal limits  TROPONIN I  URINE MICROSCOPIC-ADD ON   No results found.   1. Dizziness   2. Hyperglycemia   3. Elevated BUN   4. Elevated serum creatinine   5. Other B-complex deficiencies       MDM  Patient afebrile, non-tachycardic, alert and oriented Mild elevated blood pressure Labs ordered  EKG negative STEMI/NSTEMI, troponins negative  CBC - Hgb and Hct lowered - trend seen when compared to previous lab findings CMP lowered chloride (94), elevated glucose (373) - BUN and serum Cr elevated noted BUN 46 when compare to being 15 three years ago, Cr 2.5 when compared to findings three years ago being 1.34 - suspected renal disorder. Discussed case and lab findings with Dr. Rosalia Hammers - Dr. Rosalia Hammers to see patient.  Normal saline IV given. Due to elevated glucose, elevated BUN and Cr, dizziness that has been ongoing, changes to  medications - discontinuation of Doxazosin and Glipizide, and living alone - patient to be admitted for observation. Patient transferred to [facility] for admission on observation with Dr. Mort Sawyers.           Raymon Mutton, PA-C 02/11/13 0041

## 2013-02-10 NOTE — ED Notes (Signed)
[facility] here for transport.  

## 2013-02-11 ENCOUNTER — Encounter (HOSPITAL_COMMUNITY): Payer: Self-pay | Admitting: Internal Medicine

## 2013-02-11 DIAGNOSIS — E119 Type 2 diabetes mellitus without complications: Secondary | ICD-10-CM

## 2013-02-11 DIAGNOSIS — R799 Abnormal finding of blood chemistry, unspecified: Secondary | ICD-10-CM

## 2013-02-11 DIAGNOSIS — N189 Chronic kidney disease, unspecified: Secondary | ICD-10-CM

## 2013-02-11 DIAGNOSIS — I951 Orthostatic hypotension: Secondary | ICD-10-CM | POA: Diagnosis present

## 2013-02-11 DIAGNOSIS — N179 Acute kidney failure, unspecified: Secondary | ICD-10-CM

## 2013-02-11 DIAGNOSIS — R7989 Other specified abnormal findings of blood chemistry: Secondary | ICD-10-CM

## 2013-02-11 DIAGNOSIS — R42 Dizziness and giddiness: Secondary | ICD-10-CM

## 2013-02-11 DIAGNOSIS — R739 Hyperglycemia, unspecified: Secondary | ICD-10-CM

## 2013-02-11 LAB — GLUCOSE, CAPILLARY
Glucose-Capillary: 216 mg/dL — ABNORMAL HIGH (ref 70–99)
Glucose-Capillary: 191 mg/dL — ABNORMAL HIGH (ref 70–99)
Glucose-Capillary: 176 mg/dL — ABNORMAL HIGH (ref 70–99)
Glucose-Capillary: 173 mg/dL — ABNORMAL HIGH (ref 70–99)

## 2013-02-11 LAB — CBC
HCT: 28.4 % — ABNORMAL LOW (ref 39.0–52.0)
Hemoglobin: 9.9 g/dL — ABNORMAL LOW (ref 13.0–17.0)
MCH: 31.6 pg (ref 26.0–34.0)
MCHC: 34.9 g/dL (ref 30.0–36.0)
MCV: 90.7 fL (ref 78.0–100.0)
RDW: 12 % (ref 11.5–15.5)

## 2013-02-11 LAB — CREATININE, SERUM: GFR calc non Af Amer: 24 mL/min — ABNORMAL LOW (ref >90)

## 2013-02-11 MED ORDER — FERROUS SULFATE 325 (65 FE) MG PO TABS
325.0000 mg | ORAL_TABLET | Freq: Every day | ORAL | Status: DC
  Administered 2013-02-11 – 2013-02-13 (×3): 325 mg via ORAL
  Filled 2013-02-11 (×4): qty 1

## 2013-02-11 MED ORDER — DEXTROSE-NACL 5-0.9 % IV SOLN
INTRAVENOUS | Status: DC
  Administered 2013-02-11 (×2): via INTRAVENOUS
  Administered 2013-02-12: 75 mL/h via INTRAVENOUS
  Administered 2013-02-13: via INTRAVENOUS

## 2013-02-11 MED ORDER — CYANOCOBALAMIN 1000 MCG/ML IJ SOLN
1000.0000 ug | INTRAMUSCULAR | Status: DC
  Administered 2013-02-11: 1000 ug via INTRAMUSCULAR
  Filled 2013-02-11: qty 1

## 2013-02-11 MED ORDER — HYDRALAZINE HCL 50 MG PO TABS
100.0000 mg | ORAL_TABLET | Freq: Three times a day (TID) | ORAL | Status: DC
  Administered 2013-02-11 – 2013-02-13 (×7): 100 mg via ORAL
  Filled 2013-02-11 (×9): qty 2

## 2013-02-11 MED ORDER — LISINOPRIL 20 MG PO TABS
20.0000 mg | ORAL_TABLET | Freq: Every day | ORAL | Status: DC
  Administered 2013-02-11 – 2013-02-12 (×2): 20 mg via ORAL
  Filled 2013-02-11 (×2): qty 1

## 2013-02-11 MED ORDER — GLIPIZIDE 10 MG PO TABS
10.0000 mg | ORAL_TABLET | Freq: Two times a day (BID) | ORAL | Status: DC
  Administered 2013-02-11: 10 mg via ORAL
  Filled 2013-02-11 (×3): qty 1

## 2013-02-11 MED ORDER — HEPARIN SODIUM (PORCINE) 5000 UNIT/ML IJ SOLN
5000.0000 [IU] | Freq: Three times a day (TID) | INTRAMUSCULAR | Status: DC
  Administered 2013-02-11 – 2013-02-13 (×7): 5000 [IU] via SUBCUTANEOUS
  Filled 2013-02-11 (×10): qty 1

## 2013-02-11 MED ORDER — PANTOPRAZOLE SODIUM 40 MG PO TBEC
40.0000 mg | DELAYED_RELEASE_TABLET | Freq: Every day | ORAL | Status: DC
  Administered 2013-02-11 – 2013-02-13 (×3): 40 mg via ORAL
  Filled 2013-02-11 (×3): qty 1

## 2013-02-11 MED ORDER — INSULIN ASPART 100 UNIT/ML ~~LOC~~ SOLN: 0.0000 [IU] | Freq: Every day | SUBCUTANEOUS | Status: DC

## 2013-02-11 MED ORDER — OMEGA-3-ACID ETHYL ESTERS 1 G PO CAPS
1.0000 g | ORAL_CAPSULE | Freq: Two times a day (BID) | ORAL | Status: DC
  Administered 2013-02-11 – 2013-02-13 (×5): 1 g via ORAL
  Filled 2013-02-11 (×6): qty 1

## 2013-02-11 MED ORDER — CLOPIDOGREL BISULFATE 75 MG PO TABS
75.0000 mg | ORAL_TABLET | Freq: Every day | ORAL | Status: DC
  Administered 2013-02-11 – 2013-02-13 (×3): 75 mg via ORAL
  Filled 2013-02-11 (×4): qty 1

## 2013-02-11 MED ORDER — SIMVASTATIN 10 MG PO TABS
10.0000 mg | ORAL_TABLET | Freq: Every day | ORAL | Status: DC
  Administered 2013-02-11 – 2013-02-12 (×2): 10 mg via ORAL
  Filled 2013-02-11 (×3): qty 1

## 2013-02-11 MED ORDER — FENOFIBRATE 160 MG PO TABS
160.0000 mg | ORAL_TABLET | Freq: Every day | ORAL | Status: DC
  Administered 2013-02-11 – 2013-02-13 (×3): 160 mg via ORAL
  Filled 2013-02-11 (×3): qty 1

## 2013-02-11 MED ORDER — CYANOCOBALAMIN 1000 MCG/ML IJ SOLN: 1000.0000 ug | INTRAMUSCULAR | Status: DC

## 2013-02-11 MED ORDER — CARVEDILOL 25 MG PO TABS
25.0000 mg | ORAL_TABLET | Freq: Two times a day (BID) | ORAL | Status: DC
  Administered 2013-02-11 – 2013-02-12 (×4): 25 mg via ORAL
  Filled 2013-02-11 (×5): qty 1

## 2013-02-11 MED ORDER — ASPIRIN 81 MG PO TABS: 81.0000 mg | ORAL_TABLET | Freq: Every day | ORAL | Status: DC

## 2013-02-11 MED ORDER — SODIUM CHLORIDE 0.9 % IJ SOLN
3.0000 mL | Freq: Two times a day (BID) | INTRAMUSCULAR | Status: DC
  Administered 2013-02-11 – 2013-02-12 (×2): 3 mL via INTRAVENOUS

## 2013-02-11 MED ORDER — ASPIRIN 81 MG PO CHEW
81.0000 mg | CHEWABLE_TABLET | Freq: Every day | ORAL | Status: DC
  Administered 2013-02-11 – 2013-02-13 (×3): 81 mg via ORAL
  Filled 2013-02-11 (×3): qty 1

## 2013-02-11 MED ORDER — INSULIN ASPART 100 UNIT/ML ~~LOC~~ SOLN
0.0000 [IU] | Freq: Three times a day (TID) | SUBCUTANEOUS | Status: DC
  Administered 2013-02-11 – 2013-02-12 (×5): 2 [IU] via SUBCUTANEOUS
  Administered 2013-02-13: 1 [IU] via SUBCUTANEOUS

## 2013-02-11 NOTE — Progress Notes (Signed)
New Admission Note:   Arrival Method: Patient arrived via stretcher from [facility] Orientation: Alert and oriented x4 Telemetry: Patient was placed on telemetry  Assessment: Assessment was completed and documented  Skin: No skin issues noted on admission  IV: IV clean, dry and intact. IV fluids running  Pain: No complaints of pain Tubes: N/A Safety Measures: Given red socks and yellow arm band. Fall plan prevention given to patient and discussed.   Admission: Admission has been completed, new unit wrist band given.  6700 Orientation: Patient oriented to the unit, staff and his room. Family: No family present at bedside.   Orders have been reviewed and implemented. Will continue to monitor the patient. Call light has been placed within reach and bed alarm has been activated.   Doristine Devoid, RN  Phone number: [phone]

## 2013-02-11 NOTE — Progress Notes (Signed)
[facility] PROGRESS NOTE  Hector Gordon ZOX:[id number] DOB: 1925-05-21 DOA: 02/10/2013 PCP: Ginette Otto, MD  Assessment/Plan: Orthostatic hypotension -Likely combination of the patient's medications as well as volume depletion -Continue IV fluids -Check echocardiogram -Check hemoglobin A1c -Toprol XL, amlodipine, and Cardura have been held--> monitor blood pressure Dizziness -Check TSH -Check B12 - Continue B12 supplementation -Urinalysis was negative for pyuria Acute on chronic renal failure -Likely due to a combination of volume depletion as well as medications Diabetes mellitus type 2 -Recheck hemoglobin A1c -Discontinue glipizide as the patient is an acute on chronic renal failure - CBGs with each meal and at bedtime Hyperlipidemia -Continue statin and fenofibrate Hypertension -Continue carvedilol and monitor closely -Patient will not need to restart on metoprolol succinate as he is 32 and carvedilol -Continue lisinopril    Family Communication:   Caregiver at beside Disposition Plan:   Home when medically stable      Procedures/Studies: Mr Brain Wo Contrast  01/31/2013   *RADIOLOGY REPORT*  Clinical Data: Abnormal gait and dizziness  MRI HEAD WITHOUT CONTRAST  Technique:  Multiplanar, multiecho pulse sequences of the brain and surrounding structures were obtained according to standard protocol without intravenous contrast.  Comparison: MRI 02/21/2010  Findings: Generalized atrophy.  Chronic microvascular ischemic changes in the white matter are similar to the prior study. Chronic infarct right internal capsule unchanged.  Small chronic infarcts in the thalami unchanged.  Small chronic infarct right inferior cerebellum unchanged.  Brainstem is intact.  Negative for acute infarct.  Negative for hemorrhage or mass lesion.  IMPRESSION: Atrophy and moderate chronic ischemia.  No acute infarct and no change from 2011.   Original Report Authenticated By: Janeece Riggers, M.D.         Subjective: Patient still feels a little dizzy but is feeling better. His dizziness occurs mostly with positional changes. Denies any fevers, chills, chest pain, shortness breath, headache, nausea, vomiting, diarrhea, dysuria, hematuria.  Objective: Filed Vitals:   02/10/13 2141 02/10/13 2257 02/11/13 0435 02/11/13 0856  BP: 157/53 165/44 135/89 159/47  Pulse: 58 65 58 76  Temp:  97.7 F (36.5 C) 97.5 F (36.4 C) 98.3 F (36.8 C)  TempSrc:  Oral Oral Oral  Resp: 16 16 16 17   Height:  5\' 5"  (1.651 m)    Weight:  66.906 kg (147 lb 8 oz)    SpO2: 97% 98% 95% 95%    Intake/Output Summary (Last 24 hours) at 02/11/13 1323 Last data filed at 02/11/13 0759  Gross per 24 hour  Intake    315 ml  Output   1240 ml  Net   -925 ml   Weight change:  Exam:   General:  Pt is alert, follows commands appropriately, not in acute distress  HEENT: No icterus, No thrush, [place]/AT  Cardiovascular: RRR, S1/S2, no rubs, no gallops  Respiratory: CTA bilaterally, no wheezing, no crackles, no rhonchi  Abdomen: Soft/+BS, non tender, non distended, no guarding  Extremities: No edema, No lymphangitis, No petechiae, No rashes, no synovitis  Data Reviewed: Basic Metabolic Panel:  Recent Labs Lab 02/10/13 1725 02/11/13 0104  NA 135  --   K 4.0  --   CL 94*  --   CO2 28  --   GLUCOSE 373*  --   BUN 46*  --   CREATININE 2.50* 2.34*  CALCIUM 10.2  --    Liver Function Tests:  Recent Labs Lab 02/10/13 1725  AST 34  ALT 24  ALKPHOS 28*  BILITOT  0.4  PROT 7.0  ALBUMIN 3.8   No results found for this basename: LIPASE, AMYLASE,  in the last 168 hours No results found for this basename: AMMONIA,  in the last 168 hours CBC:  Recent Labs Lab 02/10/13 1725 02/11/13 0104  WBC 5.3 5.8  NEUTROABS 3.0  --   HGB 11.3* 9.9*  HCT 32.4* 28.4*  MCV 93.9 90.7  PLT 190 187   Cardiac Enzymes:  Recent Labs Lab 02/10/13 1725  TROPONINI <0.30   BNP: No  components found with this basename: POCBNP,  CBG:  Recent Labs Lab 02/10/13 2148 02/10/13 2305 02/11/13 0752 02/11/13 1214  GLUCAP 163* 216* 191* 176*    No results found for this or any previous visit (from the past 240 hour(s)).   Scheduled Meds: . aspirin  81 mg Oral Daily  . carvedilol  25 mg Oral BID WC  . clopidogrel  75 mg Oral Q breakfast  . cyanocobalamin  1,000 mcg Intramuscular Q30 days  . fenofibrate  160 mg Oral Daily  . ferrous sulfate  325 mg Oral Q breakfast  . glipiZIDE  10 mg Oral BID AC  . heparin  5,000 Units Subcutaneous Q8H  . hydrALAZINE  100 mg Oral TID  . insulin aspart  0-5 Units Subcutaneous QHS  . insulin aspart  0-9 Units Subcutaneous TID WC  . lisinopril  20 mg Oral Daily  . omega-3 acid ethyl esters  1 g Oral BID  . pantoprazole  40 mg Oral Daily  . simvastatin  10 mg Oral q1800  . sodium chloride  3 mL Intravenous Q12H   Continuous Infusions: . dextrose 5 % and 0.9% NaCl 75 mL/hr at 02/11/13 0104     Amjad Fikes, DO  [facility] Pager [phone]  If 7PM-7AM, please contact night-coverage [email or web address] Password [email or web address] 02/11/2013, 1:23 PM   LOS: 1 day

## 2013-02-11 NOTE — H&P (Signed)
[facility] History and Physical  Hector Gordon ZOX:[id number] DOB: 12/07/1924    PCP:   Ginette Otto, MD   Chief Complaint: dizziness.  HPI:   Hector Gordon is a 79 y/o M with PMHx of prostate cancer, GERD, HTN, DM, dyslipidemia, CAD presenting to [facility], with  dizziness that has been ongoing for the past 2 months. Patient has been being followed up with ear physician, Dr. Hyacinth Meeker ruled out vertigo. MRI performed on 01/31/2013 - ruled out stroke effect. Patient was seen by Dr. Evette Cristal [facility] in [place] - Doxazosin was discontinued due to thinking that this could be the factor leading to the blood pressure being uncontrolled. Wife reported that patient had a fall approximately 4 weeks ago, stated that patient was going for the mail, mail dropped on the floor and when patient went to go and grab it he ended up falling backwards and landed on his head. Blood pressure was taken at approximately 12:30PM - sitting was 153/59 and standing was 129/64 - as per wife recordings. He told me that when he moves his head, there is no dizziness.  It happened when he stands up too quickly. Evaluation in [facility] included a Cr of 2.5, BS of 345, and BUN of 46.  He has a normal WBC and Hb of 11.3.  Hospitalist was asked to admit him for hyperglycemia, renal insufficiency, and orthostasis.   Rewiew of Systems:  Constitutional: Negative for malaise, fever and chills. No significant weight loss or weight gain Eyes: Negative for eye pain, redness and discharge, diplopia, visual changes, or flashes of light. ENMT: Negative for ear pain, hoarseness, nasal congestion, sinus pressure and sore throat. No headaches; tinnitus, drooling, or problem swallowing. Cardiovascular: Negative for chest pain, palpitations, diaphoresis, dyspnea and peripheral edema. ; No orthopnea, PND Respiratory: Negative for cough, hemoptysis, wheezing and stridor. No pleuritic chestpain. Gastrointestinal: Negative for nausea, vomiting, diarrhea,  constipation, abdominal pain, melena, blood in stool, hematemesis, jaundice and rectal bleeding.    Genitourinary: Negative for frequency, dysuria, incontinence,flank pain and hematuria; Musculoskeletal: Negative for back pain and neck pain. Negative for swelling and trauma.;  Skin: . Negative for pruritus, rash, abrasions, bruising and skin lesion.; ulcerations Neuro: Negative for headache, and neck stiffness. Negative for weakness, altered level of consciousness , altered mental status, extremity weakness, burning feet, involuntary movement, seizure and syncope.  Psych: negative for anxiety, depression, insomnia, tearfulness, panic attacks, hallucinations, paranoia, suicidal or homicidal ideation.   Past Medical History  Diagnosis Date  . GERD (gastroesophageal reflux disease)   . Hiatal hernia   . HTN (hypertension)   . DM (diabetes mellitus)   . Dyslipidemia   . H/O: GI bleed   . Hypotension   . Prostate cancer   . CAD (coronary artery disease)     Past Surgical History  Procedure Laterality Date  . Coronary artery bypass graft    . Cholecystectomy    . Stomach surgery      removed 65%  . Billroth ii    . Upper gastrointestinal endoscopy  09/02/10    w/bx, Ulcerative esophagitis  . Cardiac valve replacement  10/2010    Medications:  HOME MEDS: Prior to Admission medications   Medication Sig Start Date End Date Taking? Authorizing Provider  amLODipine (NORVASC) 10 MG tablet Take 10 mg by mouth daily.     Yes Historical Provider, MD  aspirin 81 MG tablet Take 81 mg by mouth daily.     Yes Historical Provider, MD  carvedilol (  COREG) 25 MG tablet Take 25 mg by mouth 2 (two) times daily with a meal.   Yes Historical Provider, MD  clopidogrel (PLAVIX) 75 MG tablet Take 75 mg by mouth daily.   Yes Historical Provider, MD  cyanocobalamin (,VITAMIN B-12,) 1000 MCG/ML injection Inject 1,000 mcg into the muscle every 30 (thirty) days.   Yes Historical Provider, MD  doxazosin  (CARDURA) 2 MG tablet Take 2 mg by mouth at bedtime.   Yes Historical Provider, MD  fenofibrate (TRICOR) 145 MG tablet Take 145 mg by mouth daily.   Yes Historical Provider, MD  ferrous sulfate 325 (65 FE) MG tablet Take 325 mg by mouth daily with breakfast.     Yes Historical Provider, MD  furosemide (LASIX) 40 MG tablet Take 40 mg by mouth daily. 07/27/12  Yes Historical Provider, MD  glipiZIDE (GLUCOTROL) 10 MG tablet Take 10 mg by mouth 2 (two) times daily before a meal.     Yes Historical Provider, MD  hydrALAZINE (APRESOLINE) 10 MG tablet Take 100 mg by mouth 3 (three) times daily.    Yes Historical Provider, MD  lisinopril (PRINIVIL,ZESTRIL) 20 MG tablet Take 20 mg by mouth daily.     Yes Historical Provider, MD  metoprolol (TOPROL-XL) 50 MG 24 hr tablet Take 50 mg by mouth daily.    Yes Historical Provider, MD  Multiple Vitamins-Minerals (CENTRUM SILVER PO) Take 1 tablet by mouth daily.     Yes Historical Provider, MD  Omega-3 Fatty Acids (FISH OIL) 1000 MG CAPS Take 2 capsules by mouth daily.     Yes Historical Provider, MD  omeprazole (PRILOSEC) 40 MG capsule Take 20 mg by mouth 2 (two) times daily.    Yes Historical Provider, MD  pravastatin (PRAVACHOL) 20 MG tablet Take 20 mg by mouth daily.   Yes Historical Provider, MD  Tamsulosin HCl (FLOMAX) 0.4 MG CAPS Take 0.4 mg by mouth daily.     Yes Historical Provider, MD     Allergies:  No Known Allergies  Social History:   reports that he quit smoking about 38 years ago. He has never used smokeless tobacco. He reports that he does not drink alcohol or use illicit drugs.  Family History: History reviewed. No pertinent family history.   Physical Exam: Filed Vitals:   02/10/13 1918 02/10/13 2006 02/10/13 2141 02/10/13 2257  BP: 144/58  157/53 165/44  Pulse: 57  58 65  Temp:  97.8 F (36.6 C)  97.7 F (36.5 C)  TempSrc: Oral Rectal  Oral  Resp:   16 16  Height:    5\' 5"  (1.651 m)  Weight:    66.906 kg (147 lb 8 oz)  SpO2:  96%  97% 98%   Blood pressure 165/44, pulse 65, temperature 97.7 F (36.5 C), temperature source Oral, resp. rate 16, height 5\' 5"  (1.651 m), weight 66.906 kg (147 lb 8 oz), SpO2 98.00%.  GEN:  Pleasant patient lying in the stretcher in no acute distress; cooperative with exam. Difficult of hearing. PSYCH:  alert and oriented x4; does not appear anxious or depressed; affect is appropriate. HEENT: Mucous membranes pink and anicteric; PERRLA; EOM intact; no cervical lymphadenopathy nor thyromegaly or carotid bruit; no JVD; There were no stridor. Neck is very supple. Breasts:: Not examined CHEST WALL: No tenderness CHEST: Normal respiration, clear to auscultation bilaterally.  HEART: Regular rate and rhythm.  There are no murmur, rub, or gallops.   BACK: No kyphosis or scoliosis; no CVA tenderness ABDOMEN: soft and non-tender;  no masses, no organomegaly, normal abdominal bowel sounds; no pannus; no intertriginous candida. There is no rebound and no distention. Rectal Exam: Not done EXTREMITIES: No bone or joint deformity; age-appropriate arthropathy of the hands and knees; no edema; no ulcerations.  There is no calf tenderness. Genitalia: not examined PULSES: 2+ and symmetric SKIN: Normal hydration no rash or ulceration CNS: Cranial nerves 2-12 grossly intact no focal lateralizing neurologic deficit.  Speech is fluent; uvula elevated with phonation, facial symmetry and tongue midline. DTR are normal bilaterally, cerebella exam is intact, barbinski is negative and strengths are equaled bilaterally.  No sensory loss.   Labs on Admission:  Basic Metabolic Panel:  Recent Labs Lab 02/10/13 1725 02/11/13 0104  NA 135  --   K 4.0  --   CL 94*  --   CO2 28  --   GLUCOSE 373*  --   BUN 46*  --   CREATININE 2.50* 2.34*  CALCIUM 10.2  --    Liver Function Tests:  Recent Labs Lab 02/10/13 1725  AST 34  ALT 24  ALKPHOS 28*  BILITOT 0.4  PROT 7.0  ALBUMIN 3.8   No results found for  this basename: LIPASE, AMYLASE,  in the last 168 hours No results found for this basename: AMMONIA,  in the last 168 hours CBC:  Recent Labs Lab 02/10/13 1725 02/11/13 0104  WBC 5.3 5.8  NEUTROABS 3.0  --   HGB 11.3* 9.9*  HCT 32.4* 28.4*  MCV 93.9 90.7  PLT 190 187   Cardiac Enzymes:  Recent Labs Lab 02/10/13 1725  TROPONINI <0.30    CBG:  Recent Labs Lab 02/10/13 2148 02/10/13 2305  GLUCAP 163* 216*     Radiological Exams on Admission: No results found.  EKG: Independently reviewed. SR with 1st degree AVB.  LAD, and no acute ST-T changes.   Assessment/Plan Present on Admission:  . Orthostasis . Reflux esophagitis . DM2 (diabetes mellitus, type 2) HTN Renal insufficiency.  PLAN:  This gentleman presents with orthostatic symptomology.  He is on several hypertensive meds which can either cause or aggrevate these symptoms.   Cardura is notorious for this, along with other alpha blocker.  Norvasc can cause severe hypotension and pedal edema also.  His BS was elevated, and his Glucotrol was discontinued by the [facility] physician for unclear reason.  I will admit him to telemetry.  Will d/c his flomax, cardura, Norvasc and diuretics.  Will give IVF as he has elevated Cr and appears to be at least pre-renal.   I will continue his ace-I.  For his DM, will resume his oral hyperglycemic medications.  He is stable, full code, and will be admitted to [facility] service.    Other plans as per orders.  Code Status: FULL Unk Lightning, MD. [facility] Pager [phone] 7pm to 7am.  02/11/2013, 4:07 AM

## 2013-02-11 NOTE — ED Provider Notes (Signed)
  I performed a history and physical examination of Hector Gordon and discussed his management with .  I agree with the history, physical, assessment, and plan of care, with the following exceptions: None  I was present for the following procedures: None Time Spent in Critical Care of the patient: None Time spent in discussions with the patient and family: 10  Patient with episode of weakness and near syncope presents with hyperglycemia and renal insufficiency.  Patient's baseline labs not known.  Patient is very hoh and hear with family and friends, but lives alone.    Holli Humbles, MD 02/11/13 [phone]

## 2013-02-12 DIAGNOSIS — N189 Chronic kidney disease, unspecified: Secondary | ICD-10-CM

## 2013-02-12 DIAGNOSIS — I951 Orthostatic hypotension: Secondary | ICD-10-CM

## 2013-02-12 DIAGNOSIS — N179 Acute kidney failure, unspecified: Secondary | ICD-10-CM

## 2013-02-12 LAB — BASIC METABOLIC PANEL
BUN: 41 mg/dL — ABNORMAL HIGH (ref 6–23)
CO2: 23 mEq/L (ref 19–32)
GFR calc non Af Amer: 24 mL/min — ABNORMAL LOW (ref >90)
Glucose, Bld: 161 mg/dL — ABNORMAL HIGH (ref 70–99)
Potassium: 4.2 mEq/L (ref 3.5–5.1)

## 2013-02-12 LAB — VITAMIN B12: Vitamin B-12: >2000 pg/mL — ABNORMAL HIGH (ref 211–911)

## 2013-02-12 LAB — GLUCOSE, CAPILLARY
Glucose-Capillary: 167 mg/dL — ABNORMAL HIGH (ref 70–99)
Glucose-Capillary: 162 mg/dL — ABNORMAL HIGH (ref 70–99)

## 2013-02-12 MED ORDER — CARVEDILOL 12.5 MG PO TABS
12.5000 mg | ORAL_TABLET | Freq: Two times a day (BID) | ORAL | Status: DC
  Filled 2013-02-12 (×3): qty 1

## 2013-02-12 NOTE — Progress Notes (Signed)
  Echocardiogram 2D Echocardiogram has been performed.  Hector Gordon 02/12/2013, 2:47 PM

## 2013-02-12 NOTE — Progress Notes (Signed)
[facility] PROGRESS NOTE  Hector Gordon ZOX:[id number] DOB: [birth year] DOA: 02/10/2013 PCP: Ginette Otto, MD  Assessment/Plan: Orthostatic hypotension  -Likely combination of the patient's medications as well as volume depletion  -Continue IV fluids  -Check echocardiogram   -Toprol XL, amlodipine, and Cardura have been held--> monitor blood pressure  Dizziness  -Likely medication induced, particularly his antihypertensive agents -Check TSH--4.867--check free T4 -Check B12 -->2000 - Continue B12 supplementation  -Urinalysis was negative for pyuria  Acute on chronic renal failure  -Likely due to a combination of volume depletion as well as medications  -Discontinue lisinopril Diabetes mellitus type 2  -Recheck hemoglobin A1c--7.6 -Discontinue glipizide as the patient is an acute on chronic renal failure--granddaughter states that patient has not been receiving glipizide for the past 2 months anyway--patient has been without any agents whatsoever  - CBGs with each meal and at bedtime  -Given the patient's age and the above information, we'll not plan to restart any oral agents Hyperlipidemia  -Continue statin and fenofibrate  Hypertension  -Continue carvedilol and monitor closely  -Given the patient's bradycardia, plan to decrease carvedilol 12.5 mg twice a day -Patient will not need to restart on metoprolol succinate as he is 32 and carvedilol  -Discontinue lisinopril  Family Communication: Caregiver at beside  Disposition Plan: Home when medically stable       Procedures/Studies: Mr Brain Wo Contrast  01/31/2013   *RADIOLOGY REPORT*  Clinical Data: Abnormal gait and dizziness  MRI HEAD WITHOUT CONTRAST  Technique:  Multiplanar, multiecho pulse sequences of the brain and surrounding structures were obtained according to standard protocol without intravenous contrast.  Comparison: MRI 02/21/2010  Findings: Generalized atrophy.  Chronic microvascular ischemic  changes in the white matter are similar to the prior study. Chronic infarct right internal capsule unchanged.  Small chronic infarcts in the thalami unchanged.  Small chronic infarct right inferior cerebellum unchanged.  Brainstem is intact.  Negative for acute infarct.  Negative for hemorrhage or mass lesion.  IMPRESSION: Atrophy and moderate chronic ischemia.  No acute infarct and no change from 2011.   Original Report Authenticated By: Janeece Riggers, M.D.         Subjective: Patient states that his dizziness is 75% better. Denies any fevers, chills, chest pain, shortness of breath, nausea, vomiting, diarrhea, abdominal pain. No dysuria or hematuria. Appetite is good.  Objective: Filed Vitals:   02/12/13 0500 02/12/13 0917 02/12/13 1307 02/12/13 1716  BP: 110/31 123/32 123/41 164/47  Pulse: 50 56 62 79  Temp: 97.4 F (36.3 C) 98 F (36.7 C) 98.5 F (36.9 C) 98 F (36.7 C)  TempSrc:  Oral Oral Oral  Resp: 18 17 17 18   Height:      Weight:      SpO2: 97% 100% 100% 98%    Intake/Output Summary (Last 24 hours) at 02/12/13 1841 Last data filed at 02/12/13 1815  Gross per 24 hour  Intake 1471.25 ml  Output   1050 ml  Net 421.25 ml   Weight change:  Exam:   General:  Pt is alert, follows commands appropriately, not in acute distress  HEENT: No icterus, No thrush,[place]/AT  Cardiovascular: RRR, S1/S2, no rubs, no gallops  Respiratory: CTA bilaterally, no wheezing, no crackles, no rhonchi  Abdomen: Soft/+BS, non tender, non distended, no guarding  Extremities: No edema, No lymphangitis, No petechiae, No rashes, no synovitis  Data Reviewed: Basic Metabolic Panel:  Recent Labs Lab 02/10/13 1725 02/11/13 0104 02/12/13 0600  NA 135  --  137  K 4.0  --  4.2  CL 94*  --  103  CO2 28  --  23  GLUCOSE 373*  --  161*  BUN 46*  --  41*  CREATININE 2.50* 2.34* 2.30*  CALCIUM 10.2  --  8.1*   Liver Function Tests:  Recent Labs Lab 02/10/13 1725  AST 34  ALT 24   ALKPHOS 28*  BILITOT 0.4  PROT 7.0  ALBUMIN 3.8   No results found for this basename: LIPASE, AMYLASE,  in the last 168 hours No results found for this basename: AMMONIA,  in the last 168 hours CBC:  Recent Labs Lab 02/10/13 1725 02/11/13 0104  WBC 5.3 5.8  NEUTROABS 3.0  --   HGB 11.3* 9.9*  HCT 32.4* 28.4*  MCV 93.9 90.7  PLT 190 187   Cardiac Enzymes:  Recent Labs Lab 02/10/13 1725  TROPONINI <0.30   BNP: No components found with this basename: POCBNP,  CBG:  Recent Labs Lab 02/11/13 1709 02/11/13 2113 02/12/13 0803 02/12/13 1218 02/12/13 1717  GLUCAP 95 173* 167* 162* 180*    No results found for this or any previous visit (from the past 240 hour(s)).   Scheduled Meds: . aspirin  81 mg Oral Daily  . carvedilol  25 mg Oral BID WC  . clopidogrel  75 mg Oral Q breakfast  . cyanocobalamin  1,000 mcg Intramuscular Q30 days  . fenofibrate  160 mg Oral Daily  . ferrous sulfate  325 mg Oral Q breakfast  . heparin  5,000 Units Subcutaneous Q8H  . hydrALAZINE  100 mg Oral TID  . insulin aspart  0-5 Units Subcutaneous QHS  . insulin aspart  0-9 Units Subcutaneous TID WC  . omega-3 acid ethyl esters  1 g Oral BID  . pantoprazole  40 mg Oral Daily  . simvastatin  10 mg Oral q1800  . sodium chloride  3 mL Intravenous Q12H   Continuous Infusions: . dextrose 5 % and 0.9% NaCl 75 mL/hr (02/12/13 0826)     Angeliah Wisdom, DO  [facility] Pager [phone]  If 7PM-7AM, please contact night-coverage [email or web address] Password [facility] 02/12/2013, 6:41 PM   LOS: 2 days

## 2013-02-13 LAB — BASIC METABOLIC PANEL
BUN: 37 mg/dL — ABNORMAL HIGH (ref 6–23)
Calcium: 8.2 mg/dL — ABNORMAL LOW (ref 8.4–10.5)
Creatinine, Ser: 1.99 mg/dL — ABNORMAL HIGH (ref 0.50–1.35)
GFR calc Af Amer: 33 mL/min — ABNORMAL LOW (ref >90)
GFR calc non Af Amer: 29 mL/min — ABNORMAL LOW (ref >90)
Potassium: 3.8 mEq/L (ref 3.5–5.1)

## 2013-02-13 MED ORDER — CARVEDILOL 25 MG PO TABS
25.0000 mg | ORAL_TABLET | Freq: Two times a day (BID) | ORAL | Status: DC
Start: 2013-02-13 — End: 2013-02-13
  Filled 2013-02-13 (×2): qty 1

## 2013-02-13 NOTE — Discharge Summary (Signed)
Physician Discharge Summary  DIONYSIOS MASSMAN ZOX:[id number] DOB: 1925/09/01 DOA: 02/10/2013  PCP: Ginette Otto, MD  Admit date: 02/10/2013 Discharge date: 02/13/2013  Recommendations for Outpatient Follow-up:  1. Pt will need to follow up with Dr. Pete Glatter in 1- 2 weeks post discharge 2. Please obtain BMP to evaluate electrolytes and kidney function 3. Please also check CBC to evaluate Hg and Hct levels 4. Folllow up on echocardiogram results  Discharge Diagnoses:  Principal Problem:   Dizziness Active Problems:   Reflux esophagitis   Hyperglycemia   Elevated serum creatinine   Elevated BUN   Orthostasis   DM2 (diabetes mellitus, type 2)   Acute on chronic renal failure Orthostatic hypotension  -Likely combination of the patient's medications as well as volume depletion  -Continued IV fluid while the patient was inpatient  -Check echocardiogram--results are pending at the time of discharge -The patient's clinical condition showed significant improvement and the patient did not want to wait longer for the results of echocardiogram -Given the patient's significant clinical improvement, the results of his echocardiogram can be followed up in the outpatient setting. -Toprol XL, amlodipine, and Cardura have been discontinued--> monitor blood pressure --it was noted that the patient was on 2 beta blockers (carvedilol and metoprolol succinate) Dizziness  -Likely medication induced, particularly his antihypertensive agents  -The patient has had significant improvement with discontinuation of multiple antihypertensives and fluid replacement -Check TSH--4.867--check free T4--pending at the time of discharge -Check B12 -->2000  - Continue B12 supplementation  -Urinalysis was negative for pyuria  Acute on chronic renal failure  -Likely due to a combination of volume depletion as well as medications  -Discontinue lisinopril and furosemide -The patient was instructed to followup with  his primary care physician regarding future need to restart these medications if necessary. -On the day of discharge, his serum creatinine was 1.99 -Spoke with Dr. Pete Glatter whom stated that the patient's baseline creatinine is around 2.4 Diabetes mellitus type 2  -Recheck hemoglobin A1c--7.6  -Discontinue glipizide as the patient is an acute on chronic renal failure--granddaughter states that patient has not been receiving glipizide for the past 2 months anyway--patient has been without any agents whatsoever  - CBGs with each meal and at bedtime  -Given the patient's age and the above information, will not plan to restart any oral agents  Hyperlipidemia  -Continue statin and fenofibrate  Hypertension  -Continue carvedilol and monitor closely  --Spoke with Dr.Stoneking--pt normally has bradycardia -The patient's EKG showed first degree AV block without dysrhythmia -Patient will not need to restart on metoprolol succinate as he is 32 and carvedilol  -The patient's blood pressure remained in the systolic range 125-140 off of  his antihypertensive medications as discussed above    Discharge Condition: Stable  Disposition:  discharge home  Diet: Heart healthy Wt Readings from Last 3 Encounters:  02/12/13 68.2 kg (150 lb 5.7 oz)  08/23/12 73.211 kg (161 lb 6.4 oz)  07/17/11 75.569 kg (166 lb 9.6 oz)    History of present illness:  79 y/o M with PMHx of prostate cancer, GERD, HTN, DM, dyslipidemia, CAD presenting to [facility], with dizziness that has been ongoing for the past 2 months. Patient has been being followed up with ear physician, Dr. Hyacinth Meeker ruled out vertigo. MRI performed on 01/31/2013 - ruled out stroke effect. Patient was seen by Dr. Evette Cristal [facility] in [place] - Doxazosin was discontinued due to thinking that this could be the factor leading to the blood pressure being uncontrolled.  Caregiver reported that patient had a fall approximately 4 weeks ago, stated that patient was going  for the mail, mail dropped on the floor and when patient went to go and grab it he ended up falling backwards and landed on his head. Blood pressure was taken at approximately 12:30PM - sitting was 153/59 and standing was 129/64 - as per wife recordings. He told me that when he moves his head, there is no dizziness. It happened when he stands up too quickly. Evaluation in [facility] included a Cr of 2.5, BS of 345, and BUN of 46. He has a normal WBC and Hb of 11.3. Hospitalist was asked to admit him for hyperglycemia, renal insufficiency, and orthostasis.      Discharge Exam: Filed Vitals:   02/13/13 0508  BP: 127/31  Pulse: 99  Temp: 98.4 F (36.9 C)  Resp: 18   Filed Vitals:   02/12/13 1307 02/12/13 1716 02/12/13 2039 02/13/13 0508  BP: 123/41 164/47 136/37 127/31  Pulse: 62 79 52 99  Temp: 98.5 F (36.9 C) 98 F (36.7 C) 98.2 F (36.8 C) 98.4 F (36.9 C)  TempSrc: Oral Oral Oral Oral  Resp: 17 18 16 18   Height:   5\' 5"  (1.651 m)   Weight:   68.2 kg (150 lb 5.7 oz)   SpO2: 100% 98% 100% 100%   General: A&O x 3, NAD, pleasant, cooperative Cardiovascular: RRR, no rub, no gallop, no S3 Respiratory: CTAB, no wheeze, no rhonchi Abdomen:soft, nontender, nondistended, positive bowel sounds Extremities: No edema, No lymphangitis, no petechiae  Discharge Instructions      Discharge Orders   Future Orders Complete By Expires     Diet - low sodium heart healthy  As directed     Discharge instructions  As directed     Comments:      Stop taking lisinopril and Lasix until he followup with Dr. Pete Glatter, and follow his instructions if you still need these medications Stop Toprol XL (metoprolol succinate) Stop Cardura (doxazosin) Stop amlodipine    Increase activity slowly  As directed         Medication List    STOP taking these medications       amLODipine 10 MG tablet  Commonly known as:  NORVASC     doxazosin 2 MG tablet  Commonly known as:  CARDURA     FLOMAX 0.4 MG  Caps  Generic drug:  tamsulosin     furosemide 40 MG tablet  Commonly known as:  LASIX     lisinopril 20 MG tablet  Commonly known as:  PRINIVIL,ZESTRIL     metoprolol succinate 50 MG 24 hr tablet  Commonly known as:  TOPROL-XL      TAKE these medications       aspirin 81 MG tablet  Take 81 mg by mouth daily.     calcitRIOL 0.25 MCG capsule  Commonly known as:  ROCALTROL  Take 0.25 mcg by mouth daily.     carvedilol 25 MG tablet  Commonly known as:  COREG  Take 25 mg by mouth 2 (two) times daily with a meal.     CENTRUM SILVER PO  Take 1 tablet by mouth daily.     cyanocobalamin 1000 MCG/ML injection  Commonly known as:  (VITAMIN B-12)  Inject 1,000 mcg into the muscle every 30 (thirty) days.     fenofibrate 145 MG tablet  Commonly known as:  TRICOR  Take 145 mg by mouth daily.  ferrous fumarate 325 (106 FE) MG Tabs  Commonly known as:  HEMOCYTE - 106 mg FE  Take 1 tablet by mouth 2 (two) times daily.     ferrous sulfate 325 (65 FE) MG tablet  Take 325 mg by mouth daily with breakfast.     Fish Oil 1000 MG Caps  Take 2 capsules by mouth daily.     hydrALAZINE 10 MG tablet  Commonly known as:  APRESOLINE  Take 100 mg by mouth 3 (three) times daily.     meclizine 12.5 MG tablet  Commonly known as:  ANTIVERT  Take 12.5 mg by mouth 2 (two) times daily as needed for dizziness (vertigo). 8 day supply     omeprazole 40 MG capsule  Commonly known as:  PRILOSEC  Take 20 mg by mouth 2 (two) times daily.     polyvinyl alcohol 1.4 % ophthalmic solution  Commonly known as:  LIQUIFILM TEARS  Place 1 drop into both eyes 3 (three) times daily.     pravastatin 20 MG tablet  Commonly known as:  PRAVACHOL  Take 20 mg by mouth daily.         The results of significant diagnostics from this hospitalization (including imaging, microbiology, ancillary and laboratory) are listed below for reference.    Significant Diagnostic Studies: Mr Brain Wo  Contrast  01/31/2013   *RADIOLOGY REPORT*  Clinical Data: Abnormal gait and dizziness  MRI HEAD WITHOUT CONTRAST  Technique:  Multiplanar, multiecho pulse sequences of the brain and surrounding structures were obtained according to standard protocol without intravenous contrast.  Comparison: MRI 02/21/2010  Findings: Generalized atrophy.  Chronic microvascular ischemic changes in the white matter are similar to the prior study. Chronic infarct right internal capsule unchanged.  Small chronic infarcts in the thalami unchanged.  Small chronic infarct right inferior cerebellum unchanged.  Brainstem is intact.  Negative for acute infarct.  Negative for hemorrhage or mass lesion.  IMPRESSION: Atrophy and moderate chronic ischemia.  No acute infarct and no change from 2011.   Original Report Authenticated By: Janeece Riggers, M.D.     Microbiology: No results found for this or any previous visit (from the past 240 hour(s)).   Labs: Basic Metabolic Panel:  Recent Labs Lab 02/10/13 1725 02/11/13 0104 02/12/13 0600 02/13/13 0600  NA 135  --  137 139  K 4.0  --  4.2 3.8  CL 94*  --  103 108  CO2 28  --  23 21  GLUCOSE 373*  --  161* 156*  BUN 46*  --  41* 37*  CREATININE 2.50* 2.34* 2.30* 1.99*  CALCIUM 10.2  --  8.1* 8.2*   Liver Function Tests:  Recent Labs Lab 02/10/13 1725  AST 34  ALT 24  ALKPHOS 28*  BILITOT 0.4  PROT 7.0  ALBUMIN 3.8   No results found for this basename: LIPASE, AMYLASE,  in the last 168 hours No results found for this basename: AMMONIA,  in the last 168 hours CBC:  Recent Labs Lab 02/10/13 1725 02/11/13 0104  WBC 5.3 5.8  NEUTROABS 3.0  --   HGB 11.3* 9.9*  HCT 32.4* 28.4*  MCV 93.9 90.7  PLT 190 187   Cardiac Enzymes:  Recent Labs Lab 02/10/13 1725  TROPONINI <0.30   BNP: No components found with this basename: POCBNP,  CBG:  Recent Labs Lab 02/12/13 0803 02/12/13 1218 02/12/13 1717 02/12/13 2144 02/13/13 0735  GLUCAP 167* 162* 180*  115* 149*    Time coordinating  discharge:  Greater than 30 minutes  Signed:  Ervine Witucki, DO [facility] Pager: [phone] 02/13/2013, 9:39 AM

## 2013-02-13 NOTE — Progress Notes (Signed)
Patient was discharged home with friend. Patient was given discharge instructions specifically on medications to be continued and discontinued. Patient was told to call doctor with questions/concerns and to go to the emergency room if needed. Patient was stable upon discharge.

## 2013-07-18 ENCOUNTER — Encounter: Payer: Self-pay | Admitting: Interventional Cardiology

## 2013-07-20 ENCOUNTER — Encounter: Payer: Self-pay | Admitting: Interventional Cardiology

## 2013-07-20 ENCOUNTER — Ambulatory Visit (INDEPENDENT_AMBULATORY_CARE_PROVIDER_SITE_OTHER): Payer: Medicare Other | Admitting: Interventional Cardiology

## 2013-07-20 VITALS — BP 182/62 | HR 56 | Ht 65.0 in | Wt 160.0 lb

## 2013-07-20 DIAGNOSIS — Z954 Presence of other heart-valve replacement: Secondary | ICD-10-CM

## 2013-07-20 DIAGNOSIS — I359 Nonrheumatic aortic valve disorder, unspecified: Secondary | ICD-10-CM | POA: Insufficient documentation

## 2013-07-20 MED ORDER — AMLODIPINE BESYLATE 5 MG PO TABS: 5.0000 mg | ORAL_TABLET | Freq: Every day | ORAL | Status: AC

## 2013-07-20 NOTE — Patient Instructions (Signed)
Your physician has recommended you make the following change in your medication:   1.Start Amlodipine 5 mg 1 tablet by mouth daily.  Your physician wants you to follow-up in: 6 months with Dr. Eldridge Dace. You will receive a reminder letter in the mail two months in advance. If you don't receive a letter, please call our office to schedule the follow-up appointment.

## 2013-07-20 NOTE — Progress Notes (Signed)
Patient ID: Hector Gordon, male   DOB: 29-Sep-1924, 79 y.o.   MRN: [id number]    [address] [place], [place]  [address] Phone: [phone] Fax:  [phone]  Date:  07/20/2013   ID:  Hector Gordon, DOB 14-Dec-1924, MRN [id number]  PCP:  Ginette Otto, MD      History of Present Illness: Hector Gordon is a 79 y.o. male with hx of CABG and had an TAVR aortic valve replacement at [facility] transapical approach. At last visit 07/12/12 he had dypnea with noted BNP 322 and creat 2.25, he was continued on low dose Lasix with close monitoring Patient denies chest pain, palpitations, syncope, swelling, nor PND. SOB is better than at last visit. He has been released from [facility] by Dr Kizzie Bane. He was seen March 21,2014 at [facility] and saw a Renal MD there, they discussed that he has CRI, labs were drawn and the pt nor his family does not want to consider dialysis.He returns to [place] on Jan 29 2013 and to have lab work with PCP at that time. He states he is doing well today except problems with vertigo and seeing Dr Hyacinth Meeker for this. It has improved but not resolved. .     Wt Readings from Last 3 Encounters:  07/20/13 160 lb (72.576 kg)  02/12/13 150 lb 5.7 oz (68.2 kg)  08/23/12 161 lb 6.4 oz (73.211 kg)     Past Medical History  Diagnosis Date  . GERD (gastroesophageal reflux disease)   . Hiatal hernia   . HTN (hypertension)   . DM (diabetes mellitus)   . Dyslipidemia   . H/O: GI bleed   . Hypotension   . Prostate cancer   . CAD (coronary artery disease)     Current Outpatient Prescriptions  Medication Sig Dispense Refill  . aspirin 81 MG tablet Take 81 mg by mouth daily.        . calcitRIOL (ROCALTROL) 0.25 MCG capsule Take 0.25 mcg by mouth daily.      . carvedilol (COREG) 25 MG tablet Take 25 mg by mouth 2 (two) times daily with a meal.      . cyanocobalamin (,VITAMIN B-12,) 1000 MCG/ML injection Inject 1,000 mcg into the muscle every 30 (thirty) days.      . fenofibrate  (TRICOR) 145 MG tablet Take 145 mg by mouth daily.      . ferrous fumarate (HEMOCYTE - 106 MG FE) 325 (106 FE) MG TABS Take 1 tablet by mouth 2 (two) times daily.      . ferrous sulfate 325 (65 FE) MG tablet Take 325 mg by mouth daily with breakfast.       . hydrALAZINE (APRESOLINE) 10 MG tablet Take 100 mg by mouth 3 (three) times daily.       . meclizine (ANTIVERT) 12.5 MG tablet Take 12.5 mg by mouth 2 (two) times daily as needed for dizziness (vertigo). 8 day supply      . Multiple Vitamins-Minerals (CENTRUM SILVER PO) Take 1 tablet by mouth daily.        . Omega-3 Fatty Acids (FISH OIL) 1000 MG CAPS Take 2 capsules by mouth daily.        Marland Kitchen omeprazole (PRILOSEC) 40 MG capsule Take 20 mg by mouth 2 (two) times daily.       . polyvinyl alcohol (LIQUIFILM TEARS) 1.4 % ophthalmic solution Place 1 drop into both eyes 3 (three) times daily.      . pravastatin (  PRAVACHOL) 20 MG tablet Take 20 mg by mouth daily.       No current facility-administered medications for this visit.    Allergies:   No Known Allergies  Social History:  The patient  reports that he quit smoking about 38 years ago. He has never used smokeless tobacco. He reports that he does not drink alcohol or use illicit drugs.   Family History:  The patient's family history is not on file.   ROS:  Please see the history of present illness.  No nausea, vomiting.  No fevers, chills.  No focal weakness.  No dysuria. Fatigue, lower extremity swelling even off of amlodipine, occasional shortness of breath   All other systems reviewed and negative.   PHYSICAL EXAM: VS:  BP 182/62  Pulse 56  Ht 5\' 5"  (1.651 m)  Wt 160 lb (72.576 kg)  BMI 26.63 kg/m2  SpO2 97% Well nourished, well developed, in no acute distress HEENT: normal Neck: no JVD, no carotid bruits Cardiac:  normal S1, S2; RRR; 2/6 murmur Lungs:  clear to auscultation bilaterally, no wheezing, rhonchi or rales Abd: soft, nontender, no hepatomegaly Ext: no edema Skin:  warm and dry Neuro:   no focal abnormalities noted    ASSESSMENT AND PLAN:  1. Shortness of breath  LAB: BNP elevated in 2013 B-NATRIURETIC PEPTIDE 322 0-100 - pg/mL H    Zamora Colton,JAY 07/12/2012 04:35:02 PM > improved. Continue lasix Harward,Amy 07/13/2012 03:48:28 PM > Pt notified.    DOE. He is resuming exercise. likely from diastolic heart failure.we have to balance diuretics with his renal function.He felt better on Lasix. I think we can live with a higher creatinine as long as his symptoms are better controlled. When Lasix was stopped, his renal function improves. Avoid NSAIDs. Last Cr > 3 2. Aortic valve disorders  s/p AVR, transapical aproach. doing well. Released from [facility]. 3. Edema of legs  Improved on Lasix, but have to watch kidney function.  Persisted even off of amlodipine. Edema likely more related to venous insufficiency and diastolic dysfunction.  4. Hypertension, essential  BP high.  Important to control BP to help renal function.  Start Amlodipne 5 mg daily.  ACE-I stopped likely due to renal insufficiency.   Signed, Fredric Mare, MD, [facility] 07/20/2013 10:26 AM

## 2013-12-27 ENCOUNTER — Encounter: Payer: Self-pay | Admitting: Interventional Cardiology

## 2014-01-23 ENCOUNTER — Encounter: Payer: Self-pay | Admitting: Interventional Cardiology

## 2014-03-06 ENCOUNTER — Other Ambulatory Visit: Payer: Self-pay | Admitting: Geriatric Medicine

## 2014-03-06 DIAGNOSIS — I739 Peripheral vascular disease, unspecified: Secondary | ICD-10-CM

## 2014-03-08 ENCOUNTER — Ambulatory Visit
Admission: RE | Admit: 2014-03-08 | Discharge: 2014-03-08 | Disposition: A | Discharge status: Home / Self Care | Payer: Medicare Other | Source: Ambulatory Visit | Attending: Geriatric Medicine | Admitting: Geriatric Medicine

## 2014-03-08 DIAGNOSIS — I739 Peripheral vascular disease, unspecified: Secondary | ICD-10-CM

## 2014-06-25 ENCOUNTER — Telehealth: Payer: Self-pay | Admitting: Interventional Cardiology

## 2014-06-25 NOTE — Telephone Encounter (Signed)
Spoke with Dr. Donnetta Hutching office dental assistance, regarding pt needing  An  antibiotics prior dental procedure. Pt had Mitral valve repair on 2013. Pt is to have Amoxicillin 500 mg/ 4 tablet by mouth one hour prior a dental procedure. dental Assistance verbalized understanding.

## 2014-06-25 NOTE — Telephone Encounter (Signed)
New message     Pt is having a crown replaced tomorrow morning at 9am---should pt be pre medicated?

## 2015-02-08 ENCOUNTER — Encounter: Payer: Self-pay | Admitting: Internal Medicine

## 2015-07-03 ENCOUNTER — Other Ambulatory Visit: Payer: Self-pay | Admitting: *Deleted

## 2015-07-03 DIAGNOSIS — I509 Heart failure, unspecified: Secondary | ICD-10-CM

## 2015-07-03 DIAGNOSIS — E1122 Type 2 diabetes mellitus with diabetic chronic kidney disease: Secondary | ICD-10-CM

## 2015-07-03 DIAGNOSIS — N184 Chronic kidney disease, stage 4 (severe): Secondary | ICD-10-CM

## 2015-07-03 NOTE — Patient Outreach (Signed)
[place] [facility]) Care Management  07/03/2015  JAVIN NONG 22-Jul-1925 [id number]   Referral from [place] 2 List, assigned Johny Shock, RN to outreach for [place] Management services.  Thanks, Ronnell Freshwater. [place], [place] Assistant Phone: [phone] Fax: [phone]

## 2015-07-03 NOTE — Patient Outreach (Addendum)
[place] [facility]) Care Management  07/03/2015  Hector Gordon 01-21-1925 [id number]   RN Health Coach Telephone call to Tier 2 screening.  Patient answered phone and asked that I call his granddaughter because he is extremely hard of hearing and granddaughter handles all his care.  Rn Health coach telephone call to granddaughter. Patient has 15% renal function. He has end stage renal disease,congestive heart failure and diabetes. He lives alone and has refused to move in with granddaughter even though she built a new home with a room for him. His last Dr visit per granddaughter he was made a DNR. The Dr explained that he probably wouldn't survive CPR with the age and very  poor renal function. Per granddaughter the Dr told her he probably would not survive a port being placed for dialysis. So this would not even make him a dialysis candidate. The granddaughter and her husband are the only care givers. The granddaughter didn't know about being able to refer him to palliative care or hospice. The patient is able to afford his medications. He has fallen once with no injuries sustained. Patient last seen PCP about 3 months ago. Patient uses a cane and a rolling walker. Granddaughter very emotional stated she didn't know what to do that he had raised her. Per Granddaughter agreed to [employer] and will be looking for a call. Assessment Patient would benefit from a community nurse to assess situation and probably get referral for palliative care or hospice. Plan: Refer to community nurse for urgent evaluation for patient needs.   [place] Care Management [phone]

## 2015-07-04 NOTE — Patient Outreach (Signed)
[place] [facility]) Care Management  07/04/2015  Hector Gordon 07-17-1925 [id number]   Request from Johny Shock, RN to assign Community RN, assigned Raina Mina, RN.  Thanks, Ronnell Freshwater. [place], [place] Assistant Phone: [phone] Fax: [phone]

## 2015-07-08 ENCOUNTER — Other Ambulatory Visit: Payer: Self-pay | Admitting: *Deleted

## 2015-07-08 NOTE — Patient Outreach (Signed)
[place] [facility]) Care Management  07/08/2015  Hector Gordon 06-Feb-1925 [id number]   RN inquired with the [facility] concerning available services and criteria for a possible referral. RN provided a direct contact number for family member to contact for an official referral.  RN will attempt to contact pt's daughter Hector Gordon) concerning available services and referral if receptive. RN provided contact for this RN for further consults on this pt if appropriate.   Raina Mina, RN Care Management Coordinator [place] [facility] Main Office [phone]

## 2015-07-08 NOTE — Patient Outreach (Signed)
[place] [facility]) Care Management  07/08/2015  Hector Gordon March 15, 1925 [id number]  RN attempted to reach pt's primary caregiver daughter Marquita Palms) however RN only able to leave a HIPAA approved voice message requesting a call back for possible intervention. Will assess further at that time and make the proper referral if receptive to [facility] services.   Raina Mina, RN Care Management Coordinator [place] [facility] Main Office [phone]

## 2015-07-09 ENCOUNTER — Other Ambulatory Visit: Payer: Self-pay | Admitting: *Deleted

## 2015-07-09 NOTE — Patient Outreach (Signed)
[place] [facility]) Care Management  07/08/2015  Hector Gordon 09-09-1925 [id number]    RN spoke with pt's caregiver(Angie) today concerning pt's needs and concerns. RN reintroduced [facility] services and the purpose of today's call. Caregiver very receptive and provided verbal agreement for RN to find available resources to assist. RN discussed [place] in [place] and possible services to assist. Caregiver receptive and RN has requested pt to contact Manus Gunning the referral specialist at [phone]. Caregiver is aware that RN has contacted this agency for possible interest in assisting this pt. Caregiver very appreciative and grateful for the assistance. Caregiver expressed some emotion feelings about the whole process in assisting her loved one and again extended her gratitude. Due to the sensitivity of this care and limited support system. RN offered to continue follow ups once or twice weekly until all resources have been accessed or are actively in place. Caregiver aware that this RN can visit however prefers to contact the [facility] for available services at this time. Caregiver very thankful and feels the ongoing follow up calls will be very helpful and supportive at this time. Offered to follow again this week telephonically on Thursday and again next week. Pt has agreed and feels this will work best for her at this time. Again caregiver is aware if she feels this pt needs a visit by this caregiver that this can be arranged at anytime.  No additional inquires or request at this time will follow up accordingly.  Raina Mina, RN Care Management Coordinator [place] [facility] Main Office [phone]

## 2015-07-10 ENCOUNTER — Other Ambulatory Visit: Payer: Self-pay | Admitting: *Deleted

## 2015-07-10 NOTE — Patient Outreach (Addendum)
[place] [facility]) Care Management  07/10/2015  Hector Gordon 01-19-1925 [id number]   RN attempted outreach call today to continue following up with consented caregiver's ([name]) progress in connecting with [facility]. RN only able to leave a generic voice message. Will attempt outreach call again tomorrow.  Raina Mina, RN Care Management Coordinator [place] [facility] Main Office [phone]

## 2015-07-11 ENCOUNTER — Other Ambulatory Visit: Payer: Self-pay | Admitting: *Deleted

## 2015-07-12 NOTE — Patient Outreach (Signed)
[place] [facility]) Care Management  07/11/2015  Hector Gordon 1925/08/24 [id number]   RN received a call back from pt's grand-daughter indicating she has spoke with the Palliative care respresentative from Hospice who has contacted Dr. Felipa Eth for further information. States Hospice has obtained the pts [facility] records and pt has been approved for Hospice services for the [place] care services. Grand-daughter very grateful for expediting the start of services for the care her grandfather needs. RN will follow up accordingly with the progress and involvement with the referring agency.  No additional request or inquires at this time as RN will complete a follow up call accordingly prior to possible discharge on this pt.  Raina Mina, RN Care Management Coordinator [place] [facility] Main Office [phone]

## 2015-07-15 ENCOUNTER — Other Ambulatory Visit: Payer: Self-pay | Admitting: *Deleted

## 2015-07-15 NOTE — Patient Outreach (Signed)
[place] [facility]) Care Management  07/15/2015  Hector Gordon [birth year] [id number]  RN attempted to reach pt today however only able to leave a HIPAA approved voice message requesting a call back to inquire further. Will continue outreach calls accordingly.  Raina Mina, RN Care Management Coordinator [place] [facility] Main Office [phone]

## 2015-07-15 NOTE — Patient Outreach (Addendum)
[place] [facility]) Care Management  Erroneous Note

## 2015-07-18 ENCOUNTER — Other Ambulatory Visit: Payer: Self-pay | Admitting: *Deleted

## 2015-07-18 NOTE — Patient Outreach (Signed)
[place] [facility]) Care Management  07/18/2015  ZAYLAN KISSOON 1925/06/04 [id number]   RN attempted to contact pt's granddaughter today for an update on services being provided via Palliative care (recent referral that pt was approved to received). Currently awaiting a call back for further inquiry. Will close case at that time if appropriate.   Raina Mina, RN Care Management Coordinator [place] [facility] Main Office [phone]

## 2015-07-22 ENCOUNTER — Encounter: Payer: Self-pay | Admitting: *Deleted

## 2015-07-22 ENCOUNTER — Other Ambulatory Visit: Payer: Self-pay | Admitting: *Deleted

## 2015-07-22 NOTE — Patient Outreach (Addendum)
[facility]) Care Management  07/22/2015  Hector Gordon Nov 22, 1924 [id number]  RN attempted to reach pt's granddaughter who is the primary caregiver for this pt. Note earlier message indicated pt has been approved for hospice and will be involvement for the home based palliative care. RN will follow up with Palliative care hospice [facility]) concerning pt's involvement. RN will send letter to pt's granddaughter concerning unsuccessful contacts over the last few weeks. Due to recent referral to Palliative Care home base pt has avoided possible placement to SNF. Will also alert Dr. Armandina Gemma of pt's disposition with [facility] services at this time.   Raina Mina, RN Care Management Coordinator [facility] Main Office [phone]

## 2015-07-24 ENCOUNTER — Other Ambulatory Visit: Payer: Self-pay | Admitting: *Deleted

## 2015-07-24 NOTE — Patient Outreach (Addendum)
[place] [facility]) Care Management  07/23/2015  IZICK GASBARRO 1924/11/24 [id number]   RN has made several attempts to reach the pt's caregiver granddaughter who contacted RN today via voice message and reported pt doing very well with the involved services (Hospice) with no major issues reported. Caregiver continuously extended her gratitude and how grateful she was for [facility] expediting the referred services with Palliative/Hospice care. No additional request or inquires at this time as this case will be closed with all goals met.   Raina Mina, RN Care Management Coordinator [facility] Main Office [phone]

## 2015-07-25 ENCOUNTER — Encounter: Payer: Self-pay | Admitting: *Deleted

## 2015-07-25 NOTE — Progress Notes (Signed)
This encounter was created in error - please disregard.

## 2015-07-26 NOTE — Patient Outreach (Signed)
[facility]) Care Management  07/26/2015  KAZUTO SEVEY 09-28-1925 [id number]   Notification from Raina Mina, RN to close case due to patient is now with Hospice.  Thanks, Ronnell Freshwater. [place], [place] Assistant Phone: [phone] Fax: [phone]

## 2016-03-28 DEATH — deceased
